# Patient Record
Sex: Female | Born: 1985 | Race: White | Hispanic: No | Smoking: Never smoker
Health system: Southern US, Community
[De-identification: ages and names within clinical notes are randomized; demographics above are authoritative.]

## PROBLEM LIST (undated history)

## (undated) DIAGNOSIS — F329 Major depressive disorder, single episode, unspecified: Secondary | ICD-10-CM

## (undated) DIAGNOSIS — N200 Calculus of kidney: Secondary | ICD-10-CM

## (undated) DIAGNOSIS — D689 Coagulation defect, unspecified: Secondary | ICD-10-CM

## (undated) DIAGNOSIS — G8929 Other chronic pain: Secondary | ICD-10-CM

## (undated) DIAGNOSIS — T7840XA Allergy, unspecified, initial encounter: Secondary | ICD-10-CM

## (undated) DIAGNOSIS — I10 Essential (primary) hypertension: Secondary | ICD-10-CM

## (undated) DIAGNOSIS — F419 Anxiety disorder, unspecified: Secondary | ICD-10-CM

## (undated) DIAGNOSIS — F32A Depression, unspecified: Secondary | ICD-10-CM

## (undated) HISTORY — DX: Calculus of kidney: N20.0

## (undated) HISTORY — DX: Anxiety disorder, unspecified: F41.9

## (undated) HISTORY — DX: Depression, unspecified: F32.A

## (undated) HISTORY — PX: OTHER SURGICAL HISTORY: SHX169

## (undated) HISTORY — DX: Allergy, unspecified, initial encounter: T78.40XA

## (undated) HISTORY — PX: BREAST LUMPECTOMY: SHX2

## (undated) HISTORY — DX: Coagulation defect, unspecified: D68.9

## (undated) HISTORY — DX: Major depressive disorder, single episode, unspecified: F32.9

## (undated) HISTORY — DX: Essential (primary) hypertension: I10

---

## 2006-01-26 ENCOUNTER — Other Ambulatory Visit: Admission: RE | Admit: 2006-01-26 | Discharge: 2006-01-26 | Payer: Self-pay | Admitting: Gynecology

## 2007-08-22 ENCOUNTER — Emergency Department (HOSPITAL_COMMUNITY): Admission: EM | Admit: 2007-08-22 | Discharge: 2007-08-22 | Payer: Self-pay | Admitting: Emergency Medicine

## 2013-01-25 DIAGNOSIS — N6029 Fibroadenosis of unspecified breast: Secondary | ICD-10-CM | POA: Insufficient documentation

## 2013-05-02 DIAGNOSIS — F3342 Major depressive disorder, recurrent, in full remission: Secondary | ICD-10-CM | POA: Insufficient documentation

## 2013-05-02 DIAGNOSIS — F411 Generalized anxiety disorder: Secondary | ICD-10-CM | POA: Insufficient documentation

## 2013-06-02 ENCOUNTER — Ambulatory Visit (INDEPENDENT_AMBULATORY_CARE_PROVIDER_SITE_OTHER): Payer: No Typology Code available for payment source | Admitting: Licensed Clinical Social Worker

## 2013-06-02 DIAGNOSIS — F331 Major depressive disorder, recurrent, moderate: Secondary | ICD-10-CM

## 2013-06-08 ENCOUNTER — Ambulatory Visit (INDEPENDENT_AMBULATORY_CARE_PROVIDER_SITE_OTHER): Payer: No Typology Code available for payment source | Admitting: Licensed Clinical Social Worker

## 2013-06-08 DIAGNOSIS — F331 Major depressive disorder, recurrent, moderate: Secondary | ICD-10-CM

## 2013-06-16 ENCOUNTER — Ambulatory Visit (INDEPENDENT_AMBULATORY_CARE_PROVIDER_SITE_OTHER): Payer: No Typology Code available for payment source | Admitting: Licensed Clinical Social Worker

## 2013-06-16 DIAGNOSIS — F331 Major depressive disorder, recurrent, moderate: Secondary | ICD-10-CM

## 2013-06-23 ENCOUNTER — Ambulatory Visit: Payer: No Typology Code available for payment source | Admitting: Licensed Clinical Social Worker

## 2013-07-25 ENCOUNTER — Ambulatory Visit (INDEPENDENT_AMBULATORY_CARE_PROVIDER_SITE_OTHER): Payer: No Typology Code available for payment source | Admitting: Licensed Clinical Social Worker

## 2013-07-25 DIAGNOSIS — F331 Major depressive disorder, recurrent, moderate: Secondary | ICD-10-CM

## 2013-08-01 ENCOUNTER — Ambulatory Visit (INDEPENDENT_AMBULATORY_CARE_PROVIDER_SITE_OTHER): Payer: No Typology Code available for payment source | Admitting: Licensed Clinical Social Worker

## 2013-08-01 DIAGNOSIS — F331 Major depressive disorder, recurrent, moderate: Secondary | ICD-10-CM

## 2013-08-08 ENCOUNTER — Ambulatory Visit (INDEPENDENT_AMBULATORY_CARE_PROVIDER_SITE_OTHER): Payer: No Typology Code available for payment source | Admitting: Licensed Clinical Social Worker

## 2013-08-08 DIAGNOSIS — F331 Major depressive disorder, recurrent, moderate: Secondary | ICD-10-CM

## 2013-08-15 ENCOUNTER — Ambulatory Visit: Payer: No Typology Code available for payment source | Admitting: Licensed Clinical Social Worker

## 2013-08-22 ENCOUNTER — Ambulatory Visit: Payer: No Typology Code available for payment source | Admitting: Licensed Clinical Social Worker

## 2013-09-05 ENCOUNTER — Ambulatory Visit: Payer: No Typology Code available for payment source | Admitting: Licensed Clinical Social Worker

## 2013-09-19 ENCOUNTER — Ambulatory Visit: Payer: No Typology Code available for payment source | Admitting: Licensed Clinical Social Worker

## 2013-10-03 ENCOUNTER — Ambulatory Visit (INDEPENDENT_AMBULATORY_CARE_PROVIDER_SITE_OTHER): Payer: No Typology Code available for payment source | Admitting: Family Medicine

## 2013-10-03 VITALS — BP 138/76 | HR 101 | Temp 98.0°F | Resp 20 | Ht 66.0 in | Wt 121.4 lb

## 2013-10-03 DIAGNOSIS — M25511 Pain in right shoulder: Secondary | ICD-10-CM

## 2013-10-03 DIAGNOSIS — F439 Reaction to severe stress, unspecified: Secondary | ICD-10-CM

## 2013-10-03 DIAGNOSIS — M25519 Pain in unspecified shoulder: Secondary | ICD-10-CM

## 2013-10-03 DIAGNOSIS — Z733 Stress, not elsewhere classified: Secondary | ICD-10-CM

## 2013-10-03 DIAGNOSIS — M549 Dorsalgia, unspecified: Secondary | ICD-10-CM

## 2013-10-03 MED ORDER — HYDROCODONE-ACETAMINOPHEN 5-325 MG PO TABS: 1.0000 | ORAL_TABLET | Freq: Four times a day (QID) | ORAL | Status: DC | PRN

## 2013-10-03 MED ORDER — PREDNISONE 20 MG PO TABS: ORAL_TABLET | ORAL | Status: DC

## 2013-10-03 MED ORDER — DICLOFENAC SODIUM 75 MG PO TBEC: 75.0000 mg | DELAYED_RELEASE_TABLET | Freq: Two times a day (BID) | ORAL | Status: DC

## 2013-10-03 MED ORDER — METHOCARBAMOL 500 MG PO TABS: ORAL_TABLET | ORAL | Status: DC

## 2013-10-03 NOTE — Progress Notes (Signed)
Subjective: 28 year old lady with a long history of upper back pain problems. She has been hurting badly for the last couple of weeks, though is had some continued pain of the last 3 years. Stress aggravates it. 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 She's had a very difficult sleep. She's been worked up extensively in the past by several specialists, for 5 years ago, with no clear-cut solution. She says when she moves her shoulder around to try and relax it there is a popping in it. She gets a little sensation down her right arm.  Objective: Anxious appearing and at times tearing in lady who keeps wiggling her shoulder around. Good range of motion of right arm. Strength adequate and hand. She is tender in the right shoulder posteriorly. She is tender underneath the skin to peel the. No trauma. Apparently has been worked up in the past. She has not been doing her exercises for long time. She attributes a lot of it to stress.  Assessment: Musculoskeletal pain right upper back Anxiety  Plan: Instructed her on some stretching exercises. Prescribed anti-inflammatory and pain medication for her. Refer to orthopedics back specialist. Advised her to stop wiggling the shoulder around constantly which is grinding it and making it worse.

## 2013-10-03 NOTE — Patient Instructions (Signed)
Take the prednisone ?3 daily for 2 days, then daily for 2 days, then one daily for 2 days. Is taken after breakfast.  Take the diclofenac 75 mg one twice daily for pain and inflammation. This is in a class of a strong ibuprofen or Aleve, so did not take either of them.  Take methocarbamol muscle relaxants one in the morning, one in the afternoon, and 2 at bedtime for muscle relaxation  Take the hydrocodone/APAP 1 every 6 hours if needed for severe pain only. It will tend to cause her drowsiness, some best taken at night to help to rest  Referral is being made to a back specialist  Return here if if further problems that we can try and be of assistance with.

## 2014-03-27 DIAGNOSIS — I7300 Raynaud's syndrome without gangrene: Secondary | ICD-10-CM | POA: Insufficient documentation

## 2014-03-27 DIAGNOSIS — L209 Atopic dermatitis, unspecified: Secondary | ICD-10-CM | POA: Insufficient documentation

## 2014-04-06 DIAGNOSIS — R928 Other abnormal and inconclusive findings on diagnostic imaging of breast: Secondary | ICD-10-CM | POA: Insufficient documentation

## 2014-04-06 DIAGNOSIS — J302 Other seasonal allergic rhinitis: Secondary | ICD-10-CM | POA: Insufficient documentation

## 2014-04-06 DIAGNOSIS — J3089 Other allergic rhinitis: Secondary | ICD-10-CM

## 2014-04-06 DIAGNOSIS — Z832 Family history of diseases of the blood and blood-forming organs and certain disorders involving the immune mechanism: Secondary | ICD-10-CM | POA: Insufficient documentation

## 2014-04-06 DIAGNOSIS — R87619 Unspecified abnormal cytological findings in specimens from cervix uteri: Secondary | ICD-10-CM | POA: Insufficient documentation

## 2014-05-17 DIAGNOSIS — M47812 Spondylosis without myelopathy or radiculopathy, cervical region: Secondary | ICD-10-CM | POA: Insufficient documentation

## 2014-05-17 DIAGNOSIS — M542 Cervicalgia: Secondary | ICD-10-CM | POA: Insufficient documentation

## 2014-06-05 DIAGNOSIS — G950 Syringomyelia and syringobulbia: Secondary | ICD-10-CM | POA: Insufficient documentation

## 2014-06-21 DIAGNOSIS — M5020 Other cervical disc displacement, unspecified cervical region: Secondary | ICD-10-CM | POA: Insufficient documentation

## 2014-06-21 DIAGNOSIS — M47812 Spondylosis without myelopathy or radiculopathy, cervical region: Secondary | ICD-10-CM | POA: Insufficient documentation

## 2014-06-21 DIAGNOSIS — M47814 Spondylosis without myelopathy or radiculopathy, thoracic region: Secondary | ICD-10-CM | POA: Insufficient documentation

## 2014-06-21 DIAGNOSIS — M7918 Myalgia, other site: Secondary | ICD-10-CM | POA: Insufficient documentation

## 2014-12-24 DIAGNOSIS — E876 Hypokalemia: Secondary | ICD-10-CM | POA: Insufficient documentation

## 2014-12-24 DIAGNOSIS — Z87442 Personal history of urinary calculi: Secondary | ICD-10-CM | POA: Insufficient documentation

## 2015-02-18 DIAGNOSIS — M5412 Radiculopathy, cervical region: Secondary | ICD-10-CM | POA: Insufficient documentation

## 2015-10-18 ENCOUNTER — Emergency Department (HOSPITAL_BASED_OUTPATIENT_CLINIC_OR_DEPARTMENT_OTHER): Payer: Managed Care, Other (non HMO)

## 2015-10-18 ENCOUNTER — Emergency Department (HOSPITAL_BASED_OUTPATIENT_CLINIC_OR_DEPARTMENT_OTHER)
Admission: EM | Admit: 2015-10-18 | Discharge: 2015-10-18 | Disposition: A | Discharge status: Home / Self Care | Payer: Managed Care, Other (non HMO) | Attending: Emergency Medicine | Admitting: Emergency Medicine

## 2015-10-18 ENCOUNTER — Encounter (HOSPITAL_BASED_OUTPATIENT_CLINIC_OR_DEPARTMENT_OTHER): Payer: Self-pay | Admitting: Emergency Medicine

## 2015-10-18 DIAGNOSIS — R4781 Slurred speech: Secondary | ICD-10-CM | POA: Diagnosis present

## 2015-10-18 DIAGNOSIS — R202 Paresthesia of skin: Secondary | ICD-10-CM | POA: Diagnosis not present

## 2015-10-18 DIAGNOSIS — Z5181 Encounter for therapeutic drug level monitoring: Secondary | ICD-10-CM | POA: Insufficient documentation

## 2015-10-18 DIAGNOSIS — R479 Unspecified speech disturbances: Secondary | ICD-10-CM | POA: Insufficient documentation

## 2015-10-18 DIAGNOSIS — H539 Unspecified visual disturbance: Secondary | ICD-10-CM | POA: Insufficient documentation

## 2015-10-18 HISTORY — DX: Other chronic pain: G89.29

## 2015-10-18 LAB — COMPREHENSIVE METABOLIC PANEL
ALBUMIN: 4.2 g/dL (ref 3.5–5.0)
ALK PHOS: 30 U/L — AB (ref 38–126)
ALT: 16 U/L (ref 14–54)
AST: 19 U/L (ref 15–41)
Anion gap: 8 (ref 5–15)
BUN: 9 mg/dL (ref 6–20)
CALCIUM: 9.4 mg/dL (ref 8.9–10.3)
CHLORIDE: 105 mmol/L (ref 101–111)
CO2: 24 mmol/L (ref 22–32)
Creatinine, Ser: 0.56 mg/dL (ref 0.44–1.00)
GFR calc Af Amer: >60 mL/min (ref >60)
GFR calc non Af Amer: >60 mL/min (ref >60)
GLUCOSE: 91 mg/dL (ref 65–99)
Potassium: 3.3 mmol/L — ABNORMAL LOW (ref 3.5–5.1)
SODIUM: 137 mmol/L (ref 135–145)
TOTAL PROTEIN: 7.1 g/dL (ref 6.5–8.1)
Total Bilirubin: 1 mg/dL (ref 0.3–1.2)

## 2015-10-18 LAB — RAPID URINE DRUG SCREEN, HOSP PERFORMED
AMPHETAMINES: POSITIVE — AB
BENZODIAZEPINES: NOT DETECTED
Barbiturates: NOT DETECTED
Cocaine: NOT DETECTED
OPIATES: NOT DETECTED
Tetrahydrocannabinol: NOT DETECTED

## 2015-10-18 LAB — DIFFERENTIAL
BASOS ABS: 0 10*3/uL (ref 0.0–0.1)
BASOS PCT: 0 %
Eosinophils Absolute: 0.1 10*3/uL (ref 0.0–0.7)
Eosinophils Relative: 1 %
LYMPHS PCT: 34 %
Lymphs Abs: 3.4 10*3/uL (ref 0.7–4.0)
Monocytes Absolute: 0.7 10*3/uL (ref 0.1–1.0)
Monocytes Relative: 7 %
NEUTROS ABS: 5.8 10*3/uL (ref 1.7–7.7)
Neutrophils Relative %: 58 %

## 2015-10-18 LAB — CBC
HCT: 35 % — ABNORMAL LOW (ref 36.0–46.0)
Hemoglobin: 12 g/dL (ref 12.0–15.0)
MCH: 31.7 pg (ref 26.0–34.0)
MCHC: 34.3 g/dL (ref 30.0–36.0)
MCV: 92.3 fL (ref 78.0–100.0)
PLATELETS: 270 10*3/uL (ref 150–400)
RBC: 3.79 MIL/uL — AB (ref 3.87–5.11)
RDW: 12.2 % (ref 11.5–15.5)
WBC: 10 10*3/uL (ref 4.0–10.5)

## 2015-10-18 LAB — URINALYSIS, ROUTINE W REFLEX MICROSCOPIC
BILIRUBIN URINE: NEGATIVE
Glucose, UA: NEGATIVE mg/dL
Ketones, ur: 40 mg/dL — AB
Leukocytes, UA: NEGATIVE
Nitrite: NEGATIVE
PH: 7.5 (ref 5.0–8.0)
Protein, ur: NEGATIVE mg/dL
SPECIFIC GRAVITY, URINE: 1.015 (ref 1.005–1.030)

## 2015-10-18 LAB — PROTIME-INR
INR: 0.95
PROTHROMBIN TIME: 12.7 s (ref 11.4–15.2)

## 2015-10-18 LAB — TROPONIN I

## 2015-10-18 LAB — URINE MICROSCOPIC-ADD ON: WBC, UA: NONE SEEN WBC/hpf (ref 0–5)

## 2015-10-18 LAB — ETHANOL

## 2015-10-18 LAB — APTT: APTT: 27 s (ref 24–36)

## 2015-10-18 MED ORDER — LORAZEPAM 2 MG/ML IJ SOLN
1.0000 mg | Freq: Once | INTRAMUSCULAR | Status: AC
Start: 2015-10-18 — End: 2015-10-18
  Administered 2015-10-18: 1 mg via INTRAVENOUS
  Filled 2015-10-18: qty 1

## 2015-10-18 NOTE — ED Notes (Signed)
Pt reports that the right side of her body is tingling all over.  Started at 5pm today.  pts speech is normal, CVA screen negative.

## 2015-10-18 NOTE — ED Triage Notes (Addendum)
Pt reports visual changes and slurred speech since around 9am. Denies HA. Pt moving all extremities well. Under stress at work. Pt concerned she having a stroke.

## 2015-10-18 NOTE — ED Provider Notes (Addendum)
[facility] Provider Note   CSN: [id number] Arrival date & time: 10/18/15  2058   By signing my name below, I, Valentino Saxon, attest that this documentation has been prepared under the direction and in the presence of Melene Plan, DO. Electronically Signed: Valentino Saxon, ED Scribe. 10/18/15. 10:29 PM.  History   Chief Complaint Chief Complaint  Patient presents with  . Eye Problem  . Aphasia   The history is provided by the patient. No language interpreter was used.  Eye Problem   This is a new problem. The current episode started 12 to 24 hours ago. The problem occurs rarely. The problem has been resolved. There is a problem in both eyes. There was no injury mechanism. The pain is at a severity of 0/10. The patient is experiencing no pain. Pertinent negatives include no eye redness, no nausea and no vomiting. Associated symptoms comments: parasthesias.    HPI Comments: Lynn Matthews is a 30 y.o. female who presents to the Emergency Department complaining of intermittent, slurred speech onset this afternoon at 5 pm. Pt reports associated tingling radiating down right side and neck pain. Pt notes gradually worsening symptoms presented being at the ED. Pt denies LOC and head injury. She denies HA, abdominal pain.  She denies h/o of similar symptoms, HTN, DM. Phx of stenosis, chronic depression, PTSD. Fhx of HTN.   Past Medical History:  Diagnosis Date  . Anxiety   . Chronic pain   . Clotting disorder (HCC)   . Depression   . Kidney stones     There are no active problems to display for this patient.   Past Surgical History:  Procedure Laterality Date  . BREAST LUMPECTOMY Right   . removal of kidney stone      OB History    No data available       Home Medications    Prior to Admission medications   Medication Sig Start Date End Date Taking? Authorizing Provider  clonazePAM (KLONOPIN) 1 MG tablet Take 1 mg by mouth daily.    Historical  Provider, MD  diclofenac (VOLTAREN) 75 MG EC tablet Take 1 tablet (75 mg total) by mouth 2 (two) times daily. 10/03/13   Peyton Najjar, MD  HYDROcodone-acetaminophen (NORCO) 5-325 MG per tablet Take 1 tablet by mouth every 6 (six) hours as needed. 10/03/13   Peyton Najjar, MD  methocarbamol (ROBAXIN) 500 MG tablet Take one in the morning, one in the afternoon, and 2 at bedtime for muscle relaxants 10/03/13   Peyton Najjar, MD  predniSONE (DELTASONE) 20 MG tablet Take 3 daily for 2 days, then 2 daily for 2 days, then one daily for 2 days, for inflammation 10/03/13   Peyton Najjar, MD    Family History Family History  Problem Relation Age of Onset  . Hyperlipidemia Mother   . Hypertension Father   . Hyperlipidemia Brother     Social History Social History  Substance Use Topics  . Smoking status: Never Smoker  . Smokeless tobacco: Never Used  . Alcohol use Yes     Comment: social use     Allergies   Lamictal [lamotrigine]   Review of Systems Review of Systems  Constitutional: Negative for chills and fever.  HENT: Negative for congestion and rhinorrhea.   Eyes: Negative for redness and visual disturbance.  Respiratory: Negative for shortness of breath and wheezing.   Cardiovascular: Negative for chest pain and palpitations.  Gastrointestinal: Negative for nausea and vomiting.  Genitourinary: Negative for dysuria and urgency.  Musculoskeletal: Negative for arthralgias and myalgias.  Skin: Negative for pallor and wound.  Neurological: Positive for speech difficulty. Negative for dizziness and headaches.     Physical Exam Updated Vital Signs BP (!) 168/107 (BP Location: Left Arm)   Pulse 99   Temp 98.6 F (37 C) (Oral)   Resp 18   Ht 5\' 5"  (1.651 m)   Wt 120 lb (54.4 kg)   SpO2 100%   BMI 19.97 kg/m   Physical Exam  Constitutional: She is oriented to person, place, and time. She appears well-developed and well-nourished. No distress.  HENT:  Head: Normocephalic  and atraumatic.  Eyes: EOM are normal. Pupils are equal, round, and reactive to light.  Neck: Normal range of motion. Neck supple.  Cardiovascular: Normal rate and regular rhythm.  Exam reveals no gallop and no friction rub.   No murmur heard. Pulmonary/Chest: Effort normal. She has no wheezes. She has no rales.  Abdominal: Soft. She exhibits no distension. There is no tenderness.  Musculoskeletal: She exhibits no edema or tenderness.  Neurological: She is alert and oriented to person, place, and time. She has normal strength. No cranial nerve deficit or sensory deficit. She displays a negative Romberg sign. Coordination and gait normal. GCS eye subscore is 4. GCS verbal subscore is 5. GCS motor subscore is 6. She displays no Babinski's sign on the right side. She displays no Babinski's sign on the left side.  Reflex Scores:      Tricep reflexes are 2+ on the right side and 2+ on the left side.      Bicep reflexes are 2+ on the right side and 2+ on the left side.      Brachioradialis reflexes are 2+ on the right side and 2+ on the left side.      Patellar reflexes are 2+ on the right side and 2+ on the left side.      Achilles reflexes are 2+ on the right side and 2+ on the left side. Skin: Skin is warm and dry. She is not diaphoretic.  Psychiatric: She has a normal mood and affect. Her behavior is normal.  Nursing note and vitals reviewed.    ED Treatments / Results   DIAGNOSTIC STUDIES: Oxygen Saturation is 100% on RA, normal by my interpretation.    COORDINATION OF CARE: 9:25 PM Discussed treatment plan with pt at bedside which includes head CT and pt agreed to plan.   Labs (all labs ordered are listed, but only abnormal results are displayed) Labs Reviewed  CBC - Abnormal; Notable for the following:       Result Value   RBC 3.79 (*)    HCT 35.0 (*)    All other components within normal limits  COMPREHENSIVE METABOLIC PANEL - Abnormal; Notable for the following:    Potassium  3.3 (*)    Alkaline Phosphatase 30 (*)    All other components within normal limits  RAPID URINE DRUG SCREEN, HOSP PERFORMED - Abnormal; Notable for the following:    Amphetamines POSITIVE (*)    All other components within normal limits  URINALYSIS, ROUTINE W REFLEX MICROSCOPIC (NOT AT [facility]) - Abnormal; Notable for the following:    Hgb urine dipstick SMALL (*)    Ketones, ur 40 (*)    All other components within normal limits  URINE MICROSCOPIC-ADD ON - Abnormal; Notable for the following:    Squamous Epithelial / LPF 0-5 (*)    Bacteria,  UA RARE (*)    All other components within normal limits  ETHANOL  PROTIME-INR  APTT  DIFFERENTIAL  TROPONIN I    EKG  EKG Interpretation  Date/Time:  Friday October 18 2015 21:39:20 EDT Ventricular Rate:  95 PR Interval:    QRS Duration: 103 QT Interval:  378 QTC Calculation: 476 R Axis:   58 Text Interpretation:  Sinus rhythm Probable left atrial enlargement Borderline prolonged QT interval No significant change since last tracing Confirmed by Tennyson Kallen MD, Reuel Boom [id number]) on 10/18/2015 9:58:17 PM       Radiology Ct Head Wo Contrast  Result Date: 10/18/2015 CLINICAL DATA:  Slurred speech, difficulty with balance, tingling on right side of arm and leg EXAM: CT HEAD WITHOUT CONTRAST TECHNIQUE: Contiguous axial images were obtained from the base of the skull through the vertex without intravenous contrast. COMPARISON:  None. FINDINGS: Brain: No evidence of acute infarction, hemorrhage, hydrocephalus, extra-axial collection or mass lesion/mass effect. Evaluation of the lower brain is limited by patient motion artifact. Vascular: No hyperdense vessel or unexpected calcification. Skull: Normal. Negative for fracture or focal lesion. Sinuses/Orbits: Lobulated mucosal thickening in the right maxillary sinus. Orbits appear grossly intact. Other: None IMPRESSION: Examination is mildly compromised by patient motion. No CT evidence for acute intracranial  abnormality. MRI follow-up may be performed as clinically indicated. Electronically Signed   By: Jasmine Pang M.D.   On: 10/18/2015 22:16    Procedures Procedures (including critical care time)  Medications Ordered in ED Medications  LORazepam (ATIVAN) injection 1 mg (1 mg Intravenous Given 10/18/15 2205)     Initial Impression / Assessment and Plan / ED Course  I have reviewed the triage vital signs and the nursing notes.  Pertinent labs & imaging results that were available during my care of the patient were reviewed by me and considered in my medical decision making (see chart for details).  Clinical Course    30 yo F with transient aphasia, R sided paresthesias.  Difficult hx as patient changes symptoms and timeline during exam.  No focal neuro deficits.  At one point patient became frustrated and symptoms seem to completely abate.  I discussed the case with Dr. Roxy Manns who did not feel that imagine or transfer was warranted at this time.   CT negative, patient feels much better after ativan IV.  Suspect likely psychogenic, nursing found out that patient has just been released from a psychiatric facility 2 days ago.   10:29 PM:  I have discussed the diagnosis/risks/treatment options with the patient and family and believe the pt to be eligible for discharge home to follow-up with PCP. We also discussed returning to the ED immediately if new or worsening sx occur. We discussed the sx which are most concerning (e.g., recurrent symptoms) that necessitate immediate return. Medications administered to the patient during their visit and any new prescriptions provided to the patient are listed below.  Medications given during this visit Medications  LORazepam (ATIVAN) injection 1 mg (1 mg Intravenous Given 10/18/15 2205)     The patient appears reasonably screen and/or stabilized for discharge and I doubt any other medical condition or other [facility] requiring further screening, evaluation, or  treatment in the ED at this time prior to discharge.    Final Clinical Impressions(s) / ED Diagnoses   Final diagnoses:  Visual disturbance  Paresthesia  Speech disturbance, unspecified type    New Prescriptions New Prescriptions   No medications on file   I personally performed the services  described in this documentation, which was scribed in my presence. The recorded information has been reviewed and is accurate.      Melene Plan, DO 10/18/15 2227    Melene Plan, DO 10/18/15 2229

## 2016-12-13 IMAGING — CT CT HEAD W/O CM
3 series · 15 of 47 positions shown, 18 images · non-contrast
Comparison: None.

CLINICAL DATA: Slurred speech, difficulty with balance, tingling on
right side of arm and leg

EXAM:
CT HEAD WITHOUT CONTRAST
TECHNIQUE: Contiguous axial images were obtained from the base of the skull
through the vertex without intravenous contrast.

[Series 2: head wo · axial · 0.39mm/px · z∈[-176,-46]mm · 9 of 32 slices shown, 12 images]
[im 3/32  brain]
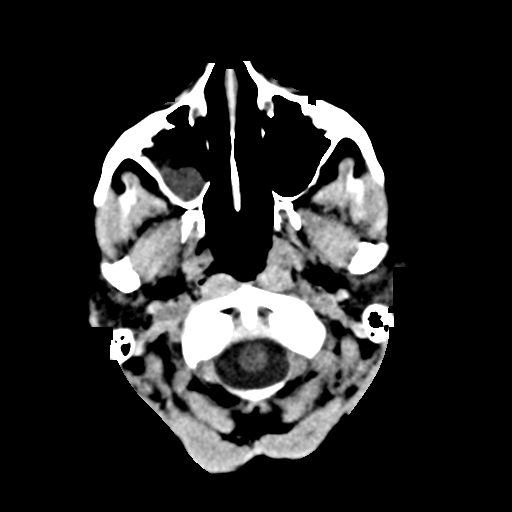
[im 3/32  bone]
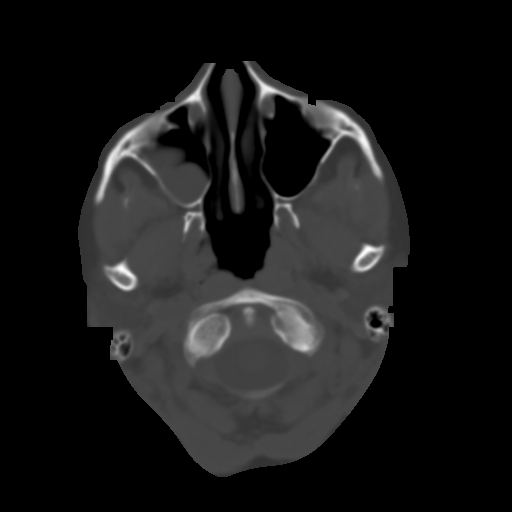
[im 6/32  brain]
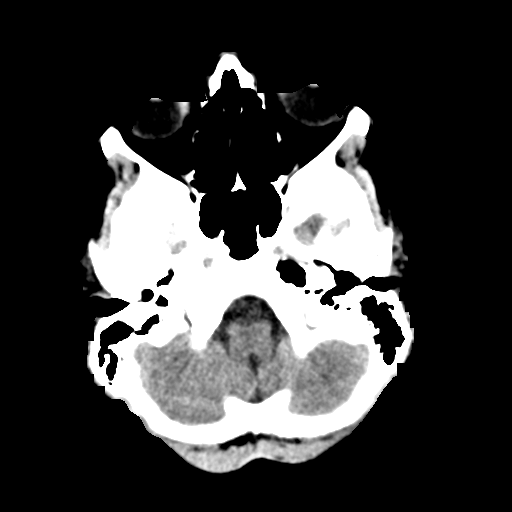
[im 9/32  brain]
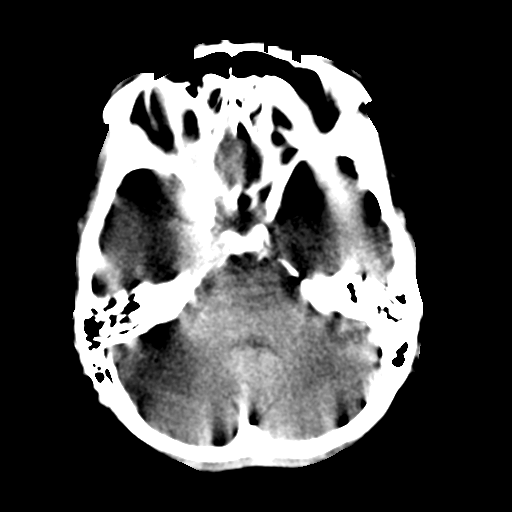
[im 12/32  brain]
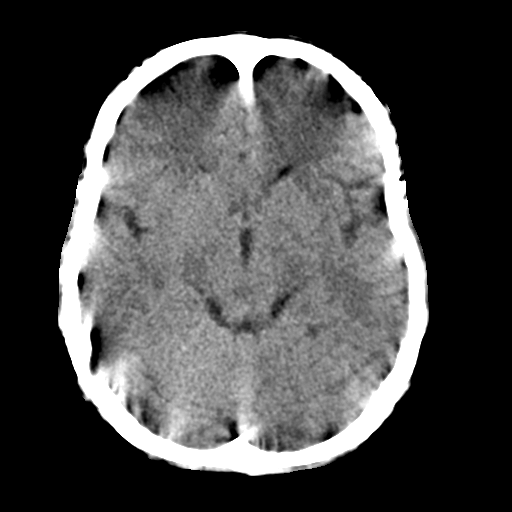
[im 17/32  brain]
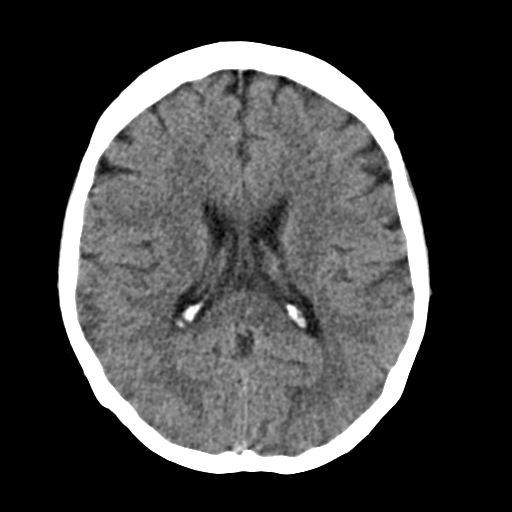
[im 17/32  bone]
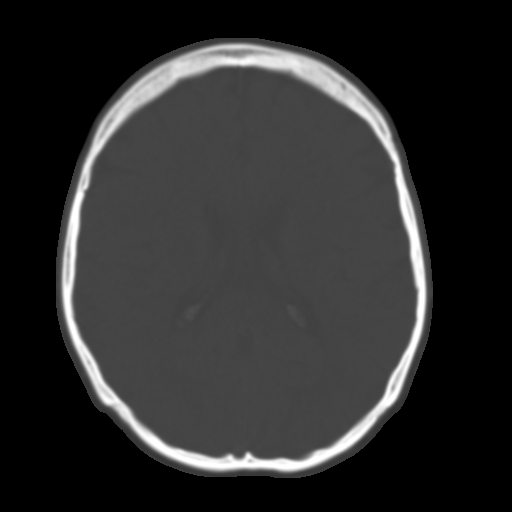
[im 20/32  brain]
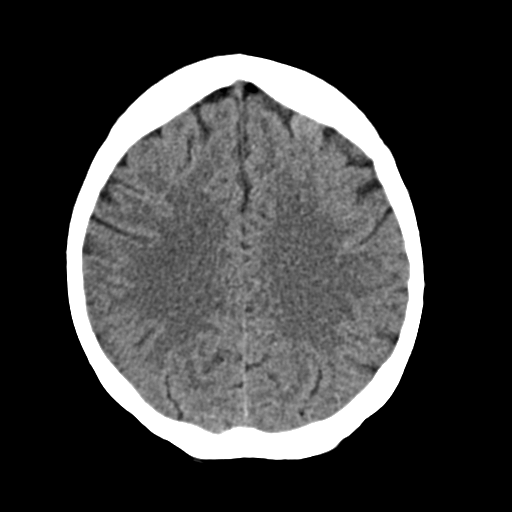
[im 23/32  brain]
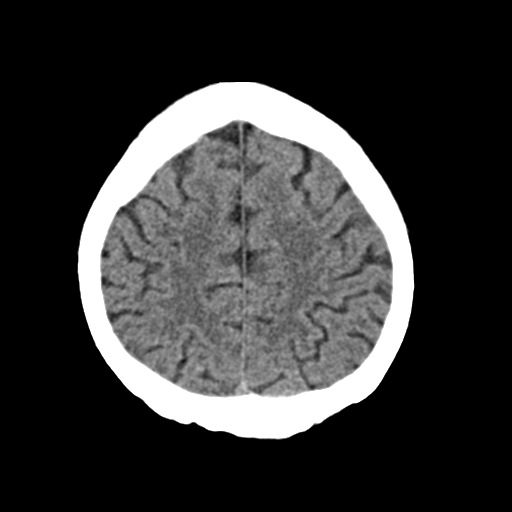
[im 26/32  brain]
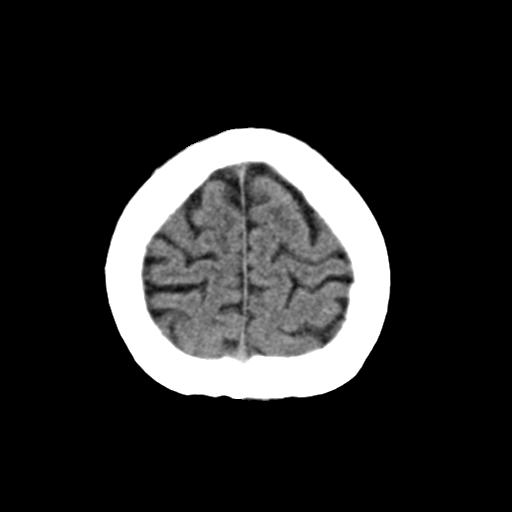
[im 29/32  brain]
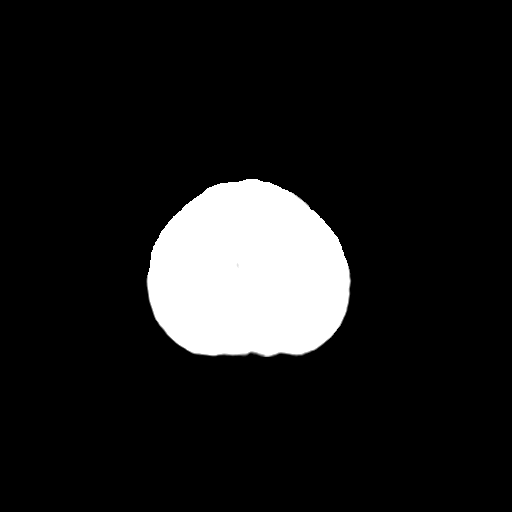
[im 29/32  bone]
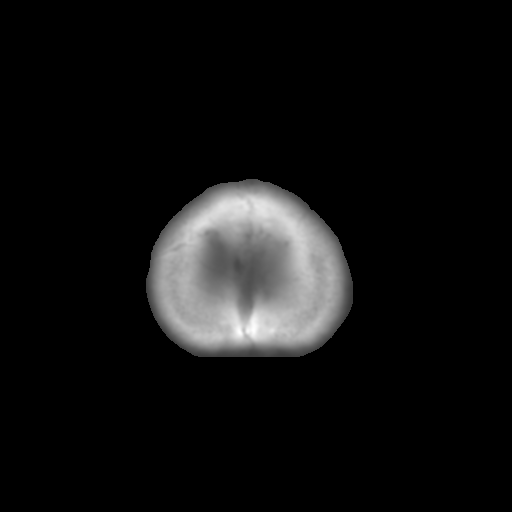

[Series 4: coronal soft · coronal · 0.37mm/px · 3 of 63 slices shown]
[im 21/63  brain]
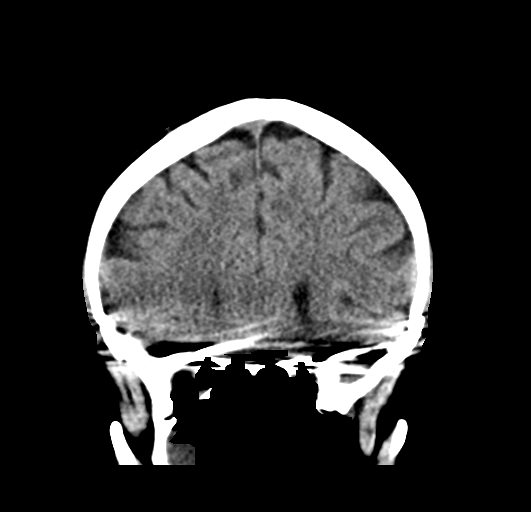
[im 28/63  brain]
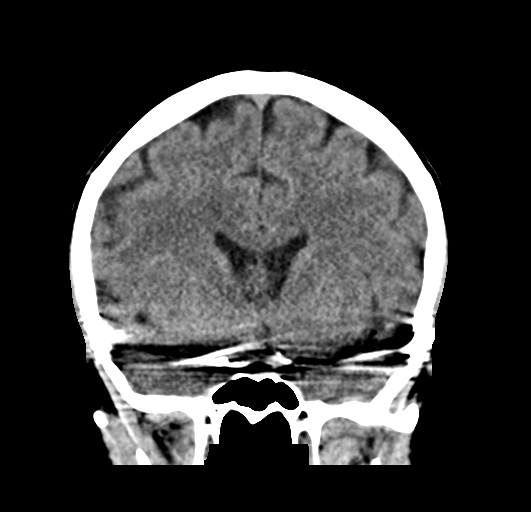
[im 35/63  brain]
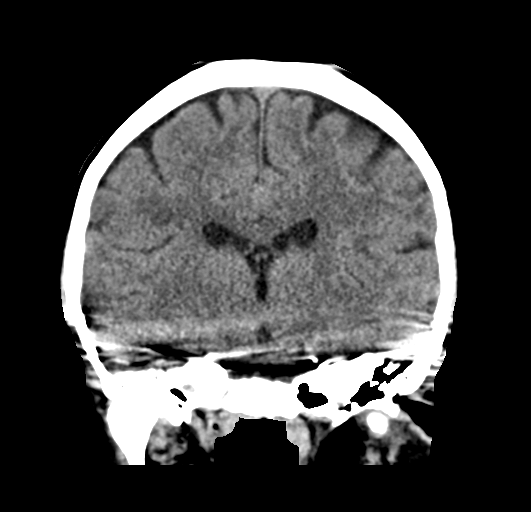

[Series 5: sag soft · sagittal · 0.31mm/px · 3 of 54 slices shown]
[im 18/54  brain]
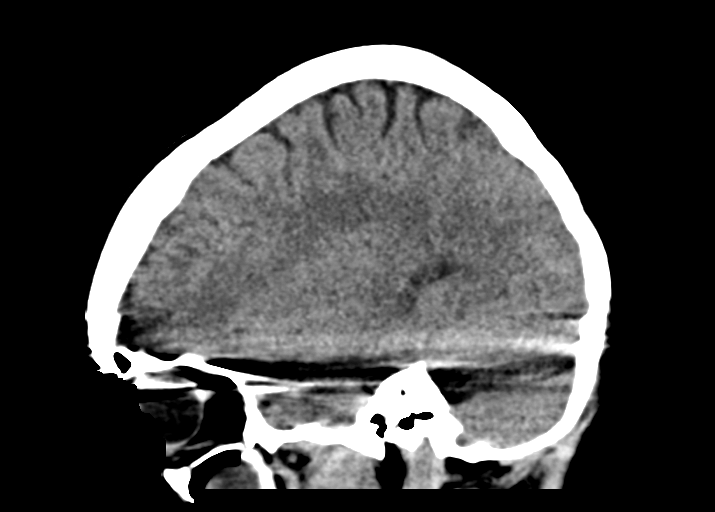
[im 27/54  brain]
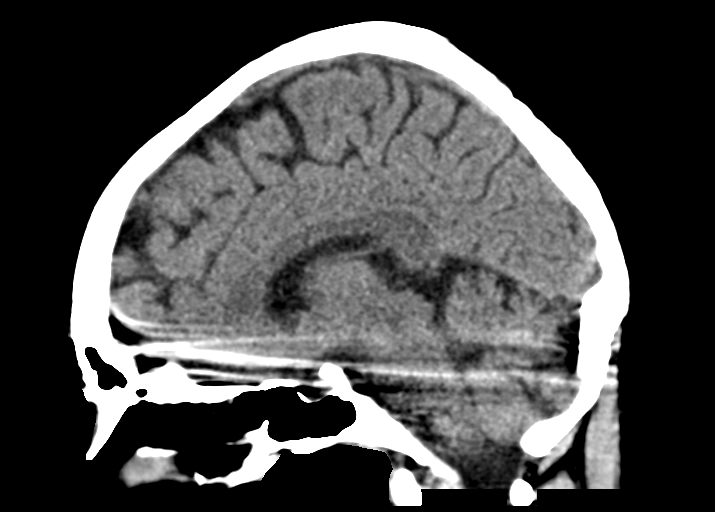
[im 36/54  brain]
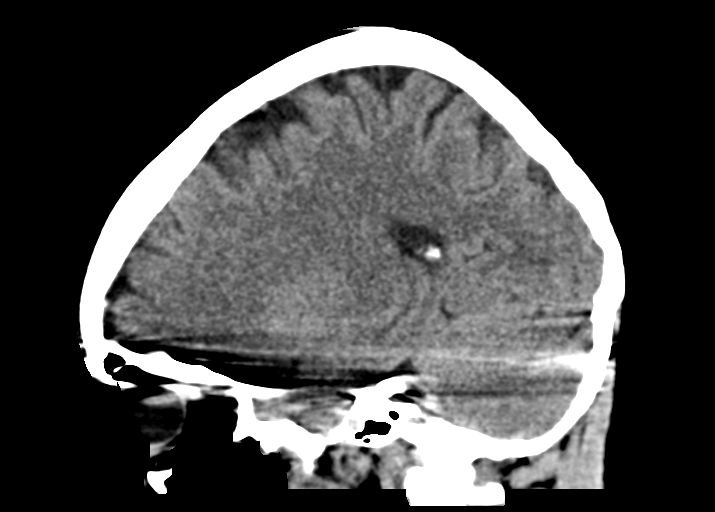

[15 of 47 positions shown; findings below may reference images not displayed]

FINDINGS: Brain: No evidence of acute infarction, hemorrhage, hydrocephalus,
extra-axial collection or mass lesion/mass effect. Evaluation of the
lower brain is limited by patient motion artifact.

Vascular: No hyperdense vessel or unexpected calcification.

Skull: Normal. Negative for fracture or focal lesion.

Sinuses/Orbits: Lobulated mucosal thickening in the right maxillary
sinus. Orbits appear grossly intact.

Other: None
IMPRESSION: Examination is mildly compromised by patient motion. No CT evidence
for acute intracranial abnormality. MRI follow-up may be performed
as clinically indicated.

## 2017-02-24 DIAGNOSIS — R569 Unspecified convulsions: Secondary | ICD-10-CM | POA: Insufficient documentation

## 2017-09-24 DIAGNOSIS — F4310 Post-traumatic stress disorder, unspecified: Secondary | ICD-10-CM | POA: Diagnosis not present

## 2017-09-24 DIAGNOSIS — F3342 Major depressive disorder, recurrent, in full remission: Secondary | ICD-10-CM | POA: Diagnosis not present

## 2017-09-24 DIAGNOSIS — F411 Generalized anxiety disorder: Secondary | ICD-10-CM | POA: Diagnosis not present

## 2017-09-24 MED FILL — DULoxetine HCL 60 MG CPEP: 60 | 90 days supply | Qty: 180 | Fill #0

## 2017-10-27 DIAGNOSIS — Z23 Encounter for immunization: Secondary | ICD-10-CM | POA: Diagnosis not present

## 2017-12-13 DIAGNOSIS — F411 Generalized anxiety disorder: Secondary | ICD-10-CM | POA: Diagnosis not present

## 2017-12-13 DIAGNOSIS — F4310 Post-traumatic stress disorder, unspecified: Secondary | ICD-10-CM | POA: Diagnosis not present

## 2017-12-13 DIAGNOSIS — F3342 Major depressive disorder, recurrent, in full remission: Secondary | ICD-10-CM | POA: Diagnosis not present

## 2017-12-16 ENCOUNTER — Ambulatory Visit: Payer: Self-pay | Admitting: Family Medicine

## 2017-12-16 DIAGNOSIS — Z0289 Encounter for other administrative examinations: Secondary | ICD-10-CM

## 2018-01-07 DIAGNOSIS — F4310 Post-traumatic stress disorder, unspecified: Secondary | ICD-10-CM | POA: Diagnosis not present

## 2018-01-07 DIAGNOSIS — F411 Generalized anxiety disorder: Secondary | ICD-10-CM | POA: Diagnosis not present

## 2018-01-07 DIAGNOSIS — F3342 Major depressive disorder, recurrent, in full remission: Secondary | ICD-10-CM | POA: Diagnosis not present

## 2018-02-10 ENCOUNTER — Ambulatory Visit: Payer: No Typology Code available for payment source | Admitting: Family Medicine

## 2018-02-11 MED FILL — DULOXETINE HCL 60 MG CPEP: 60 | 90 days supply | Qty: 180 | Fill #0 | Status: TO

## 2018-02-11 MED FILL — DEXTROAMP-AMPHETAMIN 20 MG: 20 | 30 days supply | Qty: 60 | Fill #0

## 2018-02-21 DIAGNOSIS — L811 Chloasma: Secondary | ICD-10-CM | POA: Diagnosis not present

## 2018-02-21 DIAGNOSIS — L700 Acne vulgaris: Secondary | ICD-10-CM | POA: Diagnosis not present

## 2018-02-22 MED FILL — SPIRONOLACTONE 25 MG TABS: 25 | 90 days supply | Qty: 90 | Fill #0 | Status: TO

## 2018-02-22 MED FILL — HYDROQUINONE 4 % CREA: 4 | 30 days supply | Qty: 57 | Fill #0 | Status: TO

## 2018-02-22 MED FILL — clonazePAM 1 MG TABS: 1 | 30 days supply | Qty: 60 | Fill #0 | Status: TO

## 2018-03-01 ENCOUNTER — Ambulatory Visit: Payer: 59 | Admitting: Internal Medicine

## 2018-03-01 ENCOUNTER — Other Ambulatory Visit: Payer: Self-pay

## 2018-03-01 ENCOUNTER — Encounter: Payer: Self-pay | Admitting: Internal Medicine

## 2018-03-01 VITALS — BP 132/72 | HR 89 | Temp 98.1°F | Ht 65.6 in | Wt 127.6 lb

## 2018-03-01 DIAGNOSIS — Z3202 Encounter for pregnancy test, result negative: Secondary | ICD-10-CM | POA: Diagnosis not present

## 2018-03-01 DIAGNOSIS — L709 Acne, unspecified: Secondary | ICD-10-CM

## 2018-03-01 DIAGNOSIS — M4804 Spinal stenosis, thoracic region: Secondary | ICD-10-CM | POA: Insufficient documentation

## 2018-03-01 DIAGNOSIS — N200 Calculus of kidney: Secondary | ICD-10-CM | POA: Insufficient documentation

## 2018-03-01 DIAGNOSIS — N912 Amenorrhea, unspecified: Secondary | ICD-10-CM | POA: Diagnosis not present

## 2018-03-01 DIAGNOSIS — F329 Major depressive disorder, single episode, unspecified: Secondary | ICD-10-CM | POA: Insufficient documentation

## 2018-03-01 DIAGNOSIS — M5030 Other cervical disc degeneration, unspecified cervical region: Secondary | ICD-10-CM | POA: Insufficient documentation

## 2018-03-01 DIAGNOSIS — R569 Unspecified convulsions: Secondary | ICD-10-CM

## 2018-03-01 MED ORDER — DAPSONE 5 % EX GEL: 1.0000 "application " | Freq: Every day | CUTANEOUS | 0 refills | Status: DC

## 2018-03-01 MED ORDER — ROC RETINOL CORREXION EX CREA: TOPICAL_CREAM | CUTANEOUS | 0 refills | Status: DC

## 2018-03-01 NOTE — Patient Instructions (Addendum)
   Look at this web site for diet for POS  [email or web address].com

## 2018-03-01 NOTE — Progress Notes (Signed)
Subjective:     Patient ID: Lynn Matthews , female    DOB: 19-Feb-1985 , 33 y.o.   MRN: [id number]   Chief Complaint  Patient presents with  . New Patient (Initial Visit)    HPI She is here to establish care with [place] and saw PCP  [facility]ia [facility] in the past.  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 She will ran out of health insurance this month since she is moving to [facility] and does not have a job lined up yet. Used to be on oral contraception of orthotrycycline low. Would like to get back on yaz in the past and has done well with this.  Had abnormal pap in her early 20's, since then been normal. Last pap 2017. Did not have leep or cryo. She thinks she had HPV.  LMP 5 weeks ago.   Past Medical History:  Diagnosis Date  . Anxiety   . Chronic pain   . Clotting disorder (HCC)   . Depression   . Kidney stones      Family History  Problem Relation Age of Onset  . Hyperlipidemia Mother   . Hypertension Father   . Hyperlipidemia Brother      Current Outpatient Medications:  .  amphetamine-dextroamphetamine (ADDERALL) 20 MG tablet, Take 20 mg by mouth daily., Disp: , Rfl:  .  DULoxetine (CYMBALTA) 20 MG capsule, Take 20 mg by mouth daily., Disp: , Rfl:  .  spironolactone (ALDACTONE) 25 MG tablet, Take 25 mg by mouth daily., Disp: , Rfl:  .  clonazePAM (KLONOPIN) 1 MG tablet, Take 1 mg by mouth daily., Disp: , Rfl:  .  diclofenac (VOLTAREN) 75 MG EC tablet, Take 1 tablet (75 mg total) by mouth 2 (two) times daily., Disp: 30 tablet, Rfl: 1 .  HYDROcodone-acetaminophen (NORCO) 5-325 MG per tablet, Take 1 tablet by mouth every 6 (six) hours as needed., Disp: 24 tablet, Rfl: 0 .  methocarbamol (ROBAXIN) 500 MG tablet, Take one in the morning, one in the afternoon, and 2 at bedtime for muscle relaxants, Disp: 40 tablet, Rfl: 1 .  predniSONE (DELTASONE) 20 MG tablet, Take 3 daily for 2 days, then 2 daily for 2 days, then one daily for 2 days, for inflammation, Disp: 12 tablet,  Rfl: 0   Allergies  Allergen Reactions  . Lamictal [Lamotrigine] Rash     Review of Systems  Constitutional: Negative.   HENT: Positive for postnasal drip, rhinorrhea and sneezing. Negative for congestion, ear discharge, ear pain, mouth sores, sinus pressure, sinus pain, sore throat and trouble swallowing.   Eyes: Negative for discharge and visual disturbance.  Respiratory: Negative for cough and shortness of breath.   Cardiovascular: Negative for chest pain, palpitations and leg swelling.  Gastrointestinal: Positive for diarrhea.       Gets loose stools when anxious  Endocrine: Negative for cold intolerance, heat intolerance, polydipsia and polyphagia.  Genitourinary: Negative for difficulty urinating, frequency, vaginal discharge and vaginal pain.       Has irregular periods  Skin: Positive for rash. Negative for color change, pallor and wound.       Acne on face which is new  Allergic/Immunologic: Positive for environmental allergies.  Neurological: Negative for dizziness and numbness.  Hematological: Negative for adenopathy.  Psychiatric/Behavioral: The patient is nervous/anxious.        Depression under care of psychiatrist     Today's Vitals   03/01/18 1437  BP: 132/72  Pulse: 89  Temp: 98.1 F (36.7 C)  TempSrc: Oral  SpO2: 98%  Weight: 127 lb 9.6 oz (57.9 kg)  Height: 5' 5.6" (1.666 m)   Body mass index is 20.85 kg/m.   Objective:  Physical Exam Vitals signs and nursing note reviewed.  Constitutional:      Appearance: Normal appearance. She is normal weight.  HENT:     Head: Normocephalic.     Right Ear: Tympanic membrane normal.     Left Ear: Tympanic membrane normal.     Nose: Nose normal.  Eyes:     General: No scleral icterus.    Conjunctiva/sclera: Conjunctivae normal.  Neck:     Musculoskeletal: Neck supple. No neck rigidity.  Cardiovascular:     Rate and Rhythm: Normal rate and regular rhythm.     Heart sounds: No murmur.  Pulmonary:      Effort: Pulmonary effort is normal.     Breath sounds: Normal breath sounds.  Musculoskeletal: Normal range of motion.  Lymphadenopathy:     Cervical: No cervical adenopathy.  Skin:    General: Skin is warm and dry.  Neurological:     Mental Status: She is alert and oriented to person, place, and time.     Gait: Gait normal.  Psychiatric:        Behavior: Behavior normal.        Thought Content: Thought content normal.        Judgment: Judgment normal.     Comments: Seems anxious      Assessment And Plan:   1. Acne, unspecified acne type- under care with Dermatologist.   2. Amenorrhea- since off birth control pills.  - POCT Urine Pregnancy- neg She will come tomorrow for physical and pap.      Jeydi Klingel RODRIGUEZ-SOUTHWORTH, PA-C

## 2018-03-02 ENCOUNTER — Other Ambulatory Visit (HOSPITAL_COMMUNITY)
Admission: RE | Admit: 2018-03-02 | Discharge: 2018-03-02 | Disposition: A | Discharge status: Home / Self Care | Payer: 59 | Source: Ambulatory Visit | Attending: Internal Medicine | Admitting: Internal Medicine

## 2018-03-02 ENCOUNTER — Encounter: Payer: Self-pay | Admitting: Internal Medicine

## 2018-03-02 ENCOUNTER — Ambulatory Visit: Payer: 59 | Admitting: Internal Medicine

## 2018-03-02 VITALS — BP 118/78 | HR 94 | Temp 98.1°F | Ht 66.6 in | Wt 127.5 lb

## 2018-03-02 DIAGNOSIS — Z0000 Encounter for general adult medical examination without abnormal findings: Secondary | ICD-10-CM | POA: Diagnosis not present

## 2018-03-02 DIAGNOSIS — Z113 Encounter for screening for infections with a predominantly sexual mode of transmission: Secondary | ICD-10-CM | POA: Diagnosis not present

## 2018-03-02 DIAGNOSIS — Z91018 Allergy to other foods: Secondary | ICD-10-CM | POA: Diagnosis not present

## 2018-03-02 DIAGNOSIS — Z30011 Encounter for initial prescription of contraceptive pills: Secondary | ICD-10-CM

## 2018-03-02 DIAGNOSIS — Z124 Encounter for screening for malignant neoplasm of cervix: Secondary | ICD-10-CM | POA: Diagnosis not present

## 2018-03-02 DIAGNOSIS — R748 Abnormal levels of other serum enzymes: Secondary | ICD-10-CM | POA: Diagnosis not present

## 2018-03-02 DIAGNOSIS — E559 Vitamin D deficiency, unspecified: Secondary | ICD-10-CM

## 2018-03-02 DIAGNOSIS — Z131 Encounter for screening for diabetes mellitus: Secondary | ICD-10-CM

## 2018-03-02 DIAGNOSIS — Z30019 Encounter for initial prescription of contraceptives, unspecified: Secondary | ICD-10-CM

## 2018-03-02 DIAGNOSIS — E538 Deficiency of other specified B group vitamins: Secondary | ICD-10-CM

## 2018-03-02 DIAGNOSIS — M419 Scoliosis, unspecified: Secondary | ICD-10-CM

## 2018-03-02 LAB — POCT URINALYSIS DIPSTICK
Bilirubin, UA: NEGATIVE
Blood, UA: NEGATIVE
GLUCOSE UA: NEGATIVE
Ketones, UA: NEGATIVE
LEUKOCYTES UA: NEGATIVE
NITRITE UA: NEGATIVE
PROTEIN UA: NEGATIVE
SPEC GRAV UA: 1.02 (ref 1.010–1.025)
Urobilinogen, UA: 0.2 E.U./dL
pH, UA: 7 (ref 5.0–8.0)

## 2018-03-02 LAB — POCT URINE PREGNANCY: PREG TEST UR: NEGATIVE

## 2018-03-02 MED ORDER — FLUTICASONE PROPIONATE 50 MCG/ACT NA SUSP: 2.0000 | Freq: Every day | NASAL | 11 refills | Status: DC

## 2018-03-02 MED ORDER — DROSPIRENONE-ETHINYL ESTRADIOL 3-0.02 MG PO TABS: 1.0000 | ORAL_TABLET | Freq: Every day | ORAL | 11 refills | Status: DC

## 2018-03-02 MED ORDER — VITAMIN D2 50 MCG (2000 UT) PO TABS: ORAL_TABLET | ORAL | 0 refills | Status: DC

## 2018-03-02 NOTE — Patient Instructions (Addendum)
FOLOW DR AXE'S POS  DIET RECOMMENDATIONS FOR AT LEAST 6 MONTHS TO BALANCE YOUR HORMOES AND IF THIS DOES NOT SEEM TO HELP, COME SEE DR [facility] FOR SALIVARY HORMONE TESTING THOUGH [facility].   Preventive Care 18-39 Years, Female Preventive care refers to lifestyle choices and visits with your health care provider that can promote health and wellness. What does preventive care include?   A yearly physical exam. This is also called an annual well check.  Dental exams once or twice a year.  Routine eye exams. Ask your health care provider how often you should have your eyes checked.  Personal lifestyle choices, including: ? Daily care of your teeth and gums. ? Regular physical activity. ? Eating a healthy diet. ? Avoiding tobacco and drug use. ? Limiting alcohol use. ? Practicing safe sex. ? Taking vitamin and mineral supplements as recommended by your health care provider. What happens during an annual well check? The services and screenings done by your health care provider during your annual well check will depend on your age, overall health, lifestyle risk factors, and family history of disease. Counseling Your health care provider may ask you questions about your:  Alcohol use.  Tobacco use.  Drug use.  Emotional well-being.  Home and relationship well-being.  Sexual activity.  Eating habits.  Work and work Statistician.  Method of birth control.  Menstrual cycle.  Pregnancy history. Screening You may have the following tests or measurements:  Height, weight, and BMI.  Diabetes screening. This is done by checking your blood sugar (glucose) after you have not eaten for a while (fasting).  Blood pressure.  Lipid and cholesterol levels. These may be checked every 5 years starting at age 67.  Skin check.  Hepatitis C blood test.  Hepatitis B blood test.  Sexually transmitted disease (STD) testing.  BRCA-related cancer  screening. This may be done if you have a family history of breast, ovarian, tubal, or peritoneal cancers.  Pelvic exam and Pap test. This may be done every 3 years starting at age 36. Starting at age 66, this may be done every 5 years if you have a Pap test in combination with an HPV test. Discuss your test results, treatment options, and if necessary, the need for more tests with your health care provider. Vaccines Your health care provider may recommend certain vaccines, such as:  Influenza vaccine. This is recommended every year.  Tetanus, diphtheria, and acellular pertussis (Tdap, Td) vaccine. You may need a Td booster every 10 years.  Varicella vaccine. You may need this if you have not been vaccinated.  HPV vaccine. If you are 35 or younger, you may need three doses over 6 months.  Measles, mumps, and rubella (MMR) vaccine. You may need at least one dose of MMR. You may also need a second dose.  Pneumococcal 13-valent conjugate (PCV13) vaccine. You may need this if you have certain conditions and were not previously vaccinated.  Pneumococcal polysaccharide (PPSV23) vaccine. You may need one or two doses if you smoke cigarettes or if you have certain conditions.  Meningococcal vaccine. One dose is recommended if you are age 64-21 years and a first-year college student living in a residence hall, or if you have one of several medical conditions. You may also need additional booster doses.  Hepatitis A vaccine. You may need this if you have certain conditions or if you travel or work in places where you may be exposed to hepatitis A.  Hepatitis B vaccine. You may need this if you have certain conditions or if you travel or work in places where you may be exposed to hepatitis B.  Haemophilus influenzae type b (Hib) vaccine. You may need this if you have certain risk factors. Talk to your health care provider about which screenings and vaccines you need and how often you need  them. This information is not intended to replace advice given to you by your health care provider. Make sure you discuss any questions you have with your health care provider. Document Released: 02/17/2001 Document Revised: 08/04/2016 Document Reviewed: 10/23/2014 Elsevier Interactive Patient Education  2019 [employer].

## 2018-03-02 NOTE — Progress Notes (Signed)
Subjective:     Patient ID: Lynn Matthews , female    DOB: 13-Jul-1985 , 33 y.o.   MRN: [id number]   CC " Pt is here for complete physical with pap"  HPI Pt is here for complete physical with pap. Would like to get back on birth control, currently practicing withdraw method. LMP 5 weeks ago. Also in the past few months her face is getting some acne and dermatologist told her to mention this and maybe we can place her on birth control that is better for acne. Pt things she may have POS.    Past Medical History:  Diagnosis Date  . Anxiety   . Chronic pain   . Clotting disorder (HCC)   . Depression   . Kidney stones      Family History  Problem Relation Age of Onset  . Hyperlipidemia Mother   . Hypertension Father   . 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 G0P0. She is moving to another town and has quit working for [employer].   Current Outpatient Medications:  .  amphetamine-dextroamphetamine (ADDERALL) 20 MG tablet, Take 20 mg by mouth daily., Disp: , Rfl:  .  atenolol (TENORMIN) 12.5 mg TABS tablet, , Disp: , Rfl:  .  clonazePAM (KLONOPIN) 1 MG tablet, Take 1 mg by mouth daily., Disp: , Rfl:  .  Dapsone 5 % topical gel, Apply 1 application topically daily., Disp: 1 g, Rfl: 0 .  DULoxetine (CYMBALTA) 20 MG capsule, Take 20 mg by mouth daily., Disp: , Rfl:  .  Emollient (ROC RETINOL CORREXION) CREA, Starting three times  A week for now., Disp: 1 Tube, Rfl: 0 .  spironolactone (ALDACTONE) 25 MG tablet, Take 25 mg by mouth daily., Disp: , Rfl:    Allergies  Allergen Reactions  . Lamictal [Lamotrigine] Rash     Review of Systems  Constitutional: Negative for appetite change, chills, diaphoresis and fever.  HENT: Positive for congestion, postnasal drip, rhinorrhea and sneezing. Negative for ear discharge, ear pain, sinus pressure, sinus pain,  sore throat, trouble swallowing and voice change.   Eyes: Negative for discharge and visual disturbance.  Respiratory: Negative for cough, shortness of breath and wheezing.   Cardiovascular: Negative for chest pain, palpitations and leg swelling.  Gastrointestinal: Negative for abdominal pain, constipation, diarrhea, nausea and vomiting.  Endocrine: Negative for cold intolerance, heat intolerance, polydipsia and polyphagia.  Genitourinary: Positive for menstrual problem. Negative for dysuria, frequency, urgency and vaginal discharge.       Has irregular periods.   Musculoskeletal: Positive for back pain. Negative for joint swelling and myalgias.       Has scoliosis. Has sticking out bone on her tail bone, does not know if she ever injured herself.   Skin: Negative for rash and wound.  Allergic/Immunologic: Positive for environmental allergies.  Neurological: Negative for dizziness, tremors, weakness, numbness and headaches.  Hematological: Negative for adenopathy. Does not bruise/bleed easily.  Psychiatric/Behavioral: Negative for agitation, behavioral problems, confusion and sleep disturbance. The patient is nervous/anxious.      Today's Vitals   03/02/18 1223  BP: 118/78  Pulse: 94  Temp: 98.1 F (36.7 C)  TempSrc: Oral  SpO2: 98%  Weight: 127 lb 8 oz (57.8 kg)  Height: 5' 6.6" (1.692 m)   Body mass index is 20.21 kg/m.   Objective:  Physical Exam  BP 118/78 (BP Location: Left Arm, Patient Position: Sitting, Cuff Size: Normal)   Pulse 94   Temp 98.1 F (36.7 C) (Oral)   Ht 5' 6.6" (1.692 m)   Wt 127 lb 8 oz (57.8 kg)   SpO2 98%   BMI 20.21 kg/m   General Appearance:    Alert, anxious, cooperative, no distress, appears stated age  Head:    Normocephalic, without obvious abnormality, atraumatic  Eyes:    PERRL, conjunctiva/corneas clear, EOM's intact, fundi    benign, both eyes  Ears:    Normal TM's and external ear canals, both ears  Nose:   Nares normal, septum  midline, mucosa normal, no drainage    or sinus tenderness  Throat:   Lips, mucosa, and tongue normal; teeth and gums normal  Neck:   Supple, symmetrical, trachea midline, no adenopathy;    thyroid:  no enlargement/tenderness/nodules; no carotid   bruit  Back:     Symmetric, has mild lower thoracic scoliosis and prominent bony protrusion on her L tail bone, ROM normal, no CVA tenderness  Lungs:     Clear to auscultation bilaterally, respirations unlabored  Chest Wall:    No tenderness or deformity   Heart:    Regular rate and rhythm, S1 and S2 normal, no murmur, rub   or gallop  Breast Exam:    No tenderness, masses, or nipple abnormality  Abdomen:     Soft, non-tender, bowel sounds active all four quadrants,    no masses, no organomegaly  Genitalia:    Normal female without lesion, or tenderness. Has slight clear to white discharge in vaginal area. Cervix is pink, oss was a little friable when obtaining pap.       Extremities:   Extremities normal, atraumatic, no cyanosis or edema  Pulses:   2+ and symmetric all extremities  Skin:   Skin color, texture, turgor normal, no rashes or lesions. Has mild acne on sides of lower face.   Lymph nodes:   Cervical, supraclavicular, and axillary nodes normal  Neurologic:   CNII-XII intact, normal strength, sensation and reflexes    Throughout. Has normal Rhomberg, tandem gait, tip toe and heel gait, and nose to finger.        Assessment And Plan:  1. Screening for cervical cancer- screen - Cytology -Pap Smear  2. Screen for STD (sexually transmitted disease)- screen - Cervicovaginal ancillary only - HIV antibody (with reflex)  3. Routine medical exam- routine - CMP14 + Anion Gap - CBC no Diff - Lipid Profile - TSH - T4, Free - T3, free - POCT Urinalysis Dipstick (81002)  4. Screening for diabetes mellitus- screen - Hemoglobin A1c  5. Vitamin D deficiency- chronic - Vitamin D 1,25 Dihydroxy  6. Vitamin B12 deficiency- chronic -  Vitamin B12  7. Low serum alkaline phosphatase- past hx of this  8. Multiple food allergies- chronic.  - Allergens(96) Foods  9. Encounter for female birth control- acute. I refilled her OC's.  - drospirenone-ethinyl estradiol (YAZ) 3-0.02 MG tablet; Take 1 tablet by mouth daily.  Dispense: 1 Package; Refill: 11  10. Scoliosis of thoracic spine, unspecified scoliosis type- chronic. Advised to try seeing a chiropractor.     She showed me her labs on her phone:  Labs from 2017 B12 242 Vit D- 65.2 Lynn Hetland RODRIGUEZ-SOUTHWORTH, PA-C

## 2018-03-03 ENCOUNTER — Encounter: Payer: 59 | Admitting: Internal Medicine

## 2018-03-03 ENCOUNTER — Other Ambulatory Visit: Payer: Self-pay | Admitting: Internal Medicine

## 2018-03-03 DIAGNOSIS — R748 Abnormal levels of other serum enzymes: Secondary | ICD-10-CM | POA: Insufficient documentation

## 2018-03-03 DIAGNOSIS — Z91018 Allergy to other foods: Secondary | ICD-10-CM | POA: Insufficient documentation

## 2018-03-03 DIAGNOSIS — E538 Deficiency of other specified B group vitamins: Secondary | ICD-10-CM | POA: Insufficient documentation

## 2018-03-03 DIAGNOSIS — M419 Scoliosis, unspecified: Secondary | ICD-10-CM | POA: Insufficient documentation

## 2018-03-03 DIAGNOSIS — E559 Vitamin D deficiency, unspecified: Secondary | ICD-10-CM | POA: Insufficient documentation

## 2018-03-03 LAB — HIV ANTIBODY (ROUTINE TESTING W REFLEX): HIV SCREEN 4TH GENERATION: NONREACTIVE

## 2018-03-03 LAB — VITAMIN B12: VITAMIN B 12: 423 pg/mL (ref 232–1245)

## 2018-03-03 LAB — CBC
Hematocrit: 32.9 % — ABNORMAL LOW (ref 34.0–46.6)
Hemoglobin: 11.1 g/dL (ref 11.1–15.9)
MCH: 31.8 pg (ref 26.6–33.0)
MCHC: 33.7 g/dL (ref 31.5–35.7)
MCV: 94 fL (ref 79–97)
PLATELETS: 223 10*3/uL (ref 150–450)
RBC: 3.49 x10E6/uL — AB (ref 3.77–5.28)
RDW: 12.3 % (ref 11.7–15.4)
WBC: 7.1 10*3/uL (ref 3.4–10.8)

## 2018-03-03 LAB — CMP14 + ANION GAP
ALK PHOS: 38 IU/L — AB (ref 39–117)
ALT: 14 IU/L (ref 0–32)
ANION GAP: 14 mmol/L (ref 10.0–18.0)
AST: 21 IU/L (ref 0–40)
Albumin/Globulin Ratio: 2.5 — ABNORMAL HIGH (ref 1.2–2.2)
Albumin: 4.7 g/dL (ref 3.8–4.8)
BUN/Creatinine Ratio: 15 (ref 9–23)
BUN: 9 mg/dL (ref 6–20)
Bilirubin Total: 0.5 mg/dL (ref 0.0–1.2)
CO2: 24 mmol/L (ref 20–29)
CREATININE: 0.62 mg/dL (ref 0.57–1.00)
Calcium: 9.2 mg/dL (ref 8.7–10.2)
Chloride: 103 mmol/L (ref 96–106)
GFR calc Af Amer: 137 mL/min/{1.73_m2} (ref >59)
GFR calc non Af Amer: 119 mL/min/{1.73_m2} (ref >59)
GLOBULIN, TOTAL: 1.9 g/dL (ref 1.5–4.5)
Glucose: 83 mg/dL (ref 65–99)
POTASSIUM: 4 mmol/L (ref 3.5–5.2)
SODIUM: 141 mmol/L (ref 134–144)
Total Protein: 6.6 g/dL (ref 6.0–8.5)

## 2018-03-03 LAB — HEMOGLOBIN A1C
ESTIMATED AVERAGE GLUCOSE: 94 mg/dL
Hgb A1c MFr Bld: 4.9 % (ref 4.8–5.6)

## 2018-03-03 LAB — TSH: TSH: 2.44 u[IU]/mL (ref 0.450–4.500)

## 2018-03-03 LAB — LIPID PANEL
CHOLESTEROL TOTAL: 193 mg/dL (ref 100–199)
Chol/HDL Ratio: 2.1 ratio (ref 0.0–4.4)
HDL: 93 mg/dL (ref >39)
LDL Calculated: 91 mg/dL (ref 0–99)
TRIGLYCERIDES: 47 mg/dL (ref 0–149)
VLDL CHOLESTEROL CAL: 9 mg/dL (ref 5–40)

## 2018-03-03 LAB — T3, FREE: T3, Free: 3.4 pg/mL (ref 2.0–4.4)

## 2018-03-03 LAB — T4, FREE: Free T4: 1 ng/dL (ref 0.82–1.77)

## 2018-03-03 LAB — VITAMIN D 25 HYDROXY (VIT D DEFICIENCY, FRACTURES): VIT D 25 HYDROXY: 16.5 ng/mL — AB (ref 30.0–100.0)

## 2018-03-04 LAB — CYTOLOGY - PAP: DIAGNOSIS: NEGATIVE

## 2018-03-04 LAB — CERVICOVAGINAL ANCILLARY ONLY
BACTERIAL VAGINITIS: NEGATIVE
CANDIDA VAGINITIS: POSITIVE — AB
Chlamydia: NEGATIVE
Neisseria Gonorrhea: NEGATIVE
Trichomonas: POSITIVE — AB

## 2018-03-08 ENCOUNTER — Other Ambulatory Visit: Payer: Self-pay | Admitting: Internal Medicine

## 2018-03-08 ENCOUNTER — Encounter: Payer: Self-pay | Admitting: Family Medicine

## 2018-03-08 MED ORDER — METRONIDAZOLE 500 MG PO TABS: 500.0000 mg | ORAL_TABLET | Freq: Two times a day (BID) | ORAL | 0 refills | Status: AC

## 2018-03-08 MED ORDER — FLUCONAZOLE 150 MG PO TABS: 150.0000 mg | ORAL_TABLET | Freq: Every day | ORAL | 0 refills | Status: DC

## 2018-03-09 LAB — ALLERGENS(96) FOODS
Allergen Apple, IgE: <0.1 kU/L
Allergen Barley IgE: <0.1 kU/L
Allergen Black Pepper IgE: <0.1 kU/L
Allergen Broccoli: <0.1 kU/L
Allergen Cabbage IgE: <0.1 kU/L
Allergen Carrot IgE: <0.1 kU/L
Allergen Celery IgE: <0.1 kU/L
Allergen Cinnamon IgE: <0.1 kU/L
Allergen Grapefruit IgE: <0.1 kU/L
Allergen Green Bean IgE: <0.1 kU/L
Allergen Green Bell Pepper IgE: <0.1 kU/L
Allergen Lettuce IgE: <0.1 kU/L
Allergen Lime IgE: <0.1 kU/L
Allergen Melon IgE: <0.1 kU/L
Allergen Onion IgE: <0.1 kU/L
Allergen Pear IgE: <0.1 kU/L
Allergen Potato, White IgE: <0.1 kU/L
Allergen Salmon IgE: <0.1 kU/L
Allergen Strawberry IgE: <0.1 kU/L
Allergen Watermelon IgE: <0.1 kU/L
Basil: <0.1 kU/L
Beef IgE: <0.1 kU/L
Chocolate/Cacao IgE: <0.1 kU/L
Clam IgE: <0.1 kU/L
Cranberry IgE: <0.1 kU/L
Egg White IgE: <0.1 kU/L
F020-IgE Almond: <0.1 kU/L
F023-IgE Crab: <0.1 kU/L
F045-IgE Yeast: <0.1 kU/L
F078-IgE Casein: <0.1 kU/L
F089-IgE Mustard: <0.1 kU/L
F202-IgE Cashew Nut: <0.1 kU/L
F222-IgE Tea: <0.1 kU/L
F242-IgE Bing Cherry: <0.1 kU/L
F247-IgE Honey: 0.11 kU/L — AB
F261-IgE Asparagus: <0.1 kU/L
F262-IgE Eggplant: <0.1 kU/L
F265-IgE Cumin: <0.1 kU/L
F279-IgE Chili Pepper: <0.1 kU/L
F342-IgE Olive, Black: <0.1 kU/L
F343-IgE Raspberry: <0.1 kU/L
IgE Egg (Yolk): <0.1 kU/L
Kidney Bean IgE: <0.1 kU/L
Lemon: <0.1 kU/L
Mushroom IgE: <0.1 kU/L
Peanut IgE: <0.1 kU/L
Pineapple IgE: <0.1 kU/L
Pork IgE: <0.1 kU/L
Pumpkin IgE: <0.1 kU/L
Shrimp IgE: <0.1 kU/L
Soybean IgE: <0.1 kU/L
Vanilla: <0.1 kU/L
Walnut IgE: <0.1 kU/L
Whey: <0.1 kU/L

## 2018-03-09 LAB — SPECIMEN STATUS REPORT

## 2018-03-15 MED FILL — FLUCONAZOLE 150 MG TABS: 150 | 1 days supply | Qty: 1 | Fill #0

## 2018-03-15 MED FILL — AMPHETAMINE-DEXTROAMPHETAMI: 20 | 30 days supply | Qty: 60 | Fill #0

## 2018-03-15 MED FILL — DROSPIR-ETH ESTRA 3/.02 MG: 3-0.02 | 28 days supply | Qty: 28 | Fill #0 | Status: TO

## 2018-03-15 MED FILL — metroNIDAZOLE 500 MG TABS: 500 | 7 days supply | Qty: 14 | Fill #0

## 2018-04-16 ENCOUNTER — Other Ambulatory Visit: Payer: Self-pay | Admitting: Internal Medicine

## 2018-06-06 MED FILL — SPIRONOLACTONE 25 MG TABLET: 25 | 90 days supply | Qty: 90 | Fill #0

## 2019-02-25 ENCOUNTER — Other Ambulatory Visit: Payer: Self-pay | Admitting: Internal Medicine

## 2023-10-05 ENCOUNTER — Encounter: Payer: Self-pay | Admitting: Family Medicine

## 2023-10-05 ENCOUNTER — Ambulatory Visit: Admitting: Family Medicine

## 2023-10-05 VITALS — BP 132/68 | HR 125 | Temp 98.5°F | Resp 16 | Ht 65.0 in | Wt 107.8 lb

## 2023-10-05 DIAGNOSIS — R030 Elevated blood-pressure reading, without diagnosis of hypertension: Secondary | ICD-10-CM

## 2023-10-05 DIAGNOSIS — L819 Disorder of pigmentation, unspecified: Secondary | ICD-10-CM

## 2023-10-05 DIAGNOSIS — E282 Polycystic ovarian syndrome: Secondary | ICD-10-CM | POA: Diagnosis not present

## 2023-10-05 DIAGNOSIS — D649 Anemia, unspecified: Secondary | ICD-10-CM

## 2023-10-05 DIAGNOSIS — F900 Attention-deficit hyperactivity disorder, predominantly inattentive type: Secondary | ICD-10-CM

## 2023-10-05 DIAGNOSIS — F411 Generalized anxiety disorder: Secondary | ICD-10-CM | POA: Diagnosis not present

## 2023-10-05 DIAGNOSIS — E782 Mixed hyperlipidemia: Secondary | ICD-10-CM

## 2023-10-05 MED ORDER — SPIRONOLACTONE 25 MG PO TABS: 25.0000 mg | ORAL_TABLET | Freq: Every day | ORAL | 0 refills | Status: AC

## 2023-10-05 MED ORDER — AMPHETAMINE-DEXTROAMPHETAMINE 20 MG PO TABS: 20.0000 mg | ORAL_TABLET | Freq: Every day | ORAL | 0 refills | Status: AC

## 2023-10-05 MED ORDER — TRETINOIN 0.025 % EX CREA: TOPICAL_CREAM | Freq: Every day | CUTANEOUS | 0 refills | Status: DC

## 2023-10-05 NOTE — Progress Notes (Unsigned)
 New Patient Office Visit  Subjective    Patient ID: Lynn Matthews, female    DOB: 1985-05-09  Age: 38 y.o. MRN: [id number]  CC:  Chief Complaint  Patient presents with  . Establish Care    Lynn Matthews is a 38 year old female who presents to establish care and requesting medication management.  Cervical cancer screening and gynecologic symptoms - Last Pap smear performed over five years ago, with the most recent abnormal result in 2015 - History of a breast lump requiring follow-up - No current use of birth control for the past couple of years - Irregular menstrual periods and acne symptoms  Metabolic and hematologic findings - Recent hospital visit revealed elevated glucose at 100 mg/dL - Hemoglobin measured at 11.4 g/dL - Cholesterol slightly elevated - A1c within normal range at 4.8% - No formal diagnosis of iron deficiency anemia - Low meat consumption, which may contribute to low hemoglobin - Family history of diabetes (grandmother diagnosed)  Mental health symptoms and management - History of anxiety and ADHD - On Adderall since her twenties for ADHD - Valium discontinued during recent hospital stay - Anxious regarding medication management and seeking continued prescriptions - Completed TMS therapy and EMDR for mental health support - Currently engaging in meditation and life coaching - Seeking a new mental health provider for ongoing support  Polycystic ovary syndrome (pcos) and dermatologic symptoms - On spironolactone for PCOS and hypertension - Uses retinol cream for acne and hyperpigmentation  Outpatient Encounter Medications as of 10/05/2023  Medication Sig  . amphetamine-dextroamphetamine (ADDERALL) 20 MG tablet Take 20 mg by mouth daily.  SABRA spironolactone (ALDACTONE) 25 MG tablet Take 25 mg by mouth daily.  . [DISCONTINUED] atenolol (TENORMIN) 12.5 mg TABS tablet   . Dapsone  5 % topical gel Apply 1 application topically daily. (Patient not taking:  Reported on 10/05/2023)  . drospirenone -ethinyl estradiol  (YAZ) 3-0.02 MG tablet Take 1 tablet by mouth daily.  . DULoxetine (CYMBALTA) 20 MG capsule Take 20 mg by mouth daily.  . Emollient (ROC RETINOL CORREXION) CREA Starting three times  A week for now. (Patient not taking: Reported on 10/05/2023)  . Ergocalciferol  (VITAMIN D2) 50 MCG (2000 UT) TABS One qd (Patient not taking: Reported on 10/05/2023)  . fluconazole  (DIFLUCAN ) 150 MG tablet Take 1 tablet (150 mg total) by mouth daily. (Patient not taking: Reported on 10/05/2023)  . fluticasone  (FLONASE ) 50 MCG/ACT nasal spray Place 2 sprays into both nostrils daily. (Patient not taking: Reported on 10/05/2023)  . [DISCONTINUED] clonazePAM (KLONOPIN) 1 MG tablet Take 1 mg by mouth daily.   No facility-administered encounter medications on file as of 10/05/2023.    Past Medical History:  Diagnosis Date  . Allergy    Seasonal  . Anxiety   . Chronic pain   . Depression   . Hypertension   . Kidney stones     Past Surgical History:  Procedure Laterality Date  . BREAST LUMPECTOMY Right   . removal of kidney stone     via lithotripsy    Family History  Problem Relation Age of Onset  . Hyperlipidemia Mother   . Hypertension Father   . Heart disease Father   . Hyperlipidemia Brother   . Diabetes Maternal Grandfather   . Miscarriages / Stillbirths Maternal Grandmother   . Heart disease Paternal Grandfather   . Cancer Paternal Grandmother   . Miscarriages / Stillbirths Paternal Grandmother   . Miscarriages / Stillbirths Paternal Aunt   . Stroke Maternal Uncle   .  Stroke Maternal Uncle   . Heart disease Paternal Uncle     Social History   Socioeconomic History  . Marital status: Significant Other    Spouse name: Not on file  . Number of children: Not on file  . Years of education: Not on file  . Highest education level: Not on file  Occupational History  . Not on file  Tobacco Use  . Smoking status: Never  . Smokeless  tobacco: Never  Vaping Use  . Vaping status: Every Day  . Substances: Nicotine, THC, Flavoring  Substance and Sexual Activity  . Alcohol use: Yes    Comment: social use  . Drug use: No  . Sexual activity: Not Currently    Birth control/protection: None    Comment: withdraw method  Other Topics Concern  . Not on file  Social History Narrative  . Not on file   Social Drivers of Health   Financial Resource Strain: Not on file  Food Insecurity: Not on file  Transportation Needs: Not on file  Physical Activity: Not on file  Stress: Not on file  Social Connections: Unknown (03/25/2019)   [facility]   Social Connections   . Frequency of Communication with Friends and Family: Not asked   . Frequency of Social Gatherings with Friends and Family: Not asked  Intimate Partner Violence: Unknown (03/25/2019)   [facility] from [facility]   Intimate Partner Violence   . Fear of Current or Ex-Partner: Not asked   . Emotionally Abused: Not asked   . Physically Abused: Not asked   . Sexually Abused: Not asked    Review of Systems  Constitutional:  Negative for chills, fever and malaise/fatigue.  HENT:  Negative for congestion, ear pain, sinus pain and sore throat.   Eyes: Negative.   Respiratory:  Negative for cough, shortness of breath and wheezing.   Cardiovascular:  Negative for chest pain, palpitations and leg swelling.  Gastrointestinal:  Negative for constipation, diarrhea, nausea and vomiting.  Genitourinary:  Negative for dysuria, frequency and urgency.  Musculoskeletal: Negative.   Skin: Negative.   Neurological:  Negative for dizziness and headaches.  Endo/Heme/Allergies: Negative.   Psychiatric/Behavioral: Negative.          Objective    BP 132/68   Pulse (!) 125   Temp 98.5 F (36.9 C) (Temporal)   Resp 16   Ht 5' 5 (1.651 m)   Wt 107 lb 12.8 oz (48.9 kg)   LMP 07/19/2023 (Approximate)   SpO2 97%   BMI 17.94 kg/m   Physical Exam  {Labs  (Optional):23779}    Assessment & Plan:   Problem List Items Addressed This Visit   None   No follow-ups on file.   Jon Birmingham, CMA

## 2023-10-05 NOTE — Patient Instructions (Addendum)
 Clinical Follow-up (MH and/or SA Services):  Name of Provider Agency Referred: [facility] Date/Time of Appointment: Wednesday, 09/22/23 at 8:00am virtual appt. Details sent to patient's email. Phone: [phone] Fax: [phone] Address: [address] [facility] (near [employer] [facility]) [address]  Reason: Outpatient Counseling/Medication Management   [facility] Jade Montana ,  Regina Mozingo,

## 2023-10-06 DIAGNOSIS — D649 Anemia, unspecified: Secondary | ICD-10-CM | POA: Insufficient documentation

## 2023-10-06 DIAGNOSIS — E782 Mixed hyperlipidemia: Secondary | ICD-10-CM | POA: Insufficient documentation

## 2023-10-06 DIAGNOSIS — L819 Disorder of pigmentation, unspecified: Secondary | ICD-10-CM | POA: Insufficient documentation

## 2023-10-06 DIAGNOSIS — E282 Polycystic ovarian syndrome: Secondary | ICD-10-CM | POA: Insufficient documentation

## 2023-10-06 DIAGNOSIS — F900 Attention-deficit hyperactivity disorder, predominantly inattentive type: Secondary | ICD-10-CM | POA: Insufficient documentation

## 2023-10-06 DIAGNOSIS — R030 Elevated blood-pressure reading, without diagnosis of hypertension: Secondary | ICD-10-CM | POA: Insufficient documentation

## 2023-10-06 NOTE — Assessment & Plan Note (Signed)
 Attention-deficit hyperactivity disorder (ADHD) ADHD diagnosed in her twenties, primarily affecting focus. Prefers Adderall over Valium due to recent hospital discontinuation of Valium. Expressed anxiety about medication management due to recent changes in healthcare providers. - Prescribe Adderall for ADHD management. - Refer to a psychiatric specialist for ongoing management and medication adjustments. Side effects - none  Effective dose for daily functioning - PDMP REVIEWED TODAY.  Does not refill the medicine every 30 days. No suspicion for abuse or diversion or overuse.

## 2023-10-06 NOTE — Assessment & Plan Note (Addendum)
 History of PCOS with acne and post-inflammatory hyperpigmentation - Prescribe retinol cream for acne and hyperpigmentation.

## 2023-10-06 NOTE — Assessment & Plan Note (Addendum)
 Cholesterol levels slightly elevated. Discussed lifestyle modifications as a first-line approach before re-evaluation. High egg consumption may contribute to elevated cholesterol.  Labs drawn 09/04/23 @ [facility]  Total cholesterol - 223 LDL - 91  - Implement dietary changes to reduce cholesterol, focusing on reducing fatty foods and possibly egg consumption. - Re-evaluate cholesterol levels in three months.

## 2023-10-06 NOTE — Assessment & Plan Note (Addendum)
 Hemoglobin slightly low at 11.4. Differential includes microcytic anemia or thalassemia. Limited meat intake noted. Labs drawn on 09/04/23 @ [facility]  Hemoglobin 11.4 Hematocrit 33.1 - Consider dietary modifications to increase iron intake. - Consider B12 and folate labs at next visit

## 2023-10-06 NOTE — Assessment & Plan Note (Signed)
 Polycystic ovary syndrome (PCOS) with acne and post-inflammatory hyperpigmentation PCOS with associated acne and hyperpigmentation. Previously managed with spironolactone and topical retinol cream. Interested in resuming birth control for symptom management. - Prescribe spironolactone for PCOS management. - Prescribe retinol cream for acne and hyperpigmentation. - Plan to initiate birth control post-physical examination and pregnancy test.

## 2023-10-06 NOTE — Assessment & Plan Note (Addendum)
 Generalized anxiety disorder and depression Generalized anxiety disorder and depression managed with Abilify, but reports drowsiness as a side effect. Interested in finding a new mental health provider. Discussed previous experiences with TMS therapy and EMDR.    10/05/2023    3:06 PM  GAD 7 : Generalized Anxiety Score  Nervous, Anxious, on Edge 1  Control/stop worrying 0  Worry too much - different things 0  Trouble relaxing 1  Restless 1  Easily annoyed or irritable 0  Afraid - awful might happen 0  Total GAD 7 Score 3  Anxiety Difficulty Somewhat difficult  - Continue with CBT - Continue Abilify with current dosing adjustments. - History of taking Valium 2 mg as needed for anxiety and discussed seeing a specialist for prescription  - Refer to psychiatric specialists Regina Mozingo and Jade Montana  for evaluation and management of anxiety and depression.

## 2023-10-06 NOTE — Assessment & Plan Note (Signed)
 Elevated blood pressure Blood pressure slightly elevated. Spironolactone may assist in management due to its antihypertensive properties. BP Readings from Last 3 Encounters:  10/05/23 132/68  03/02/18 118/78  03/01/18 132/72    - Continue spironolactone as it may aid in blood pressure control.

## 2023-10-12 ENCOUNTER — Encounter: Admitting: Family Medicine

## 2023-10-18 ENCOUNTER — Telehealth: Payer: Self-pay

## 2023-10-18 NOTE — Telephone Encounter (Signed)
 Copied from CRM [id number]. Topic: Clinical - Medication Question >> Oct 18, 2023 12:08 PM Delon DASEN wrote: Reason for CRM: Dr Dorien has discontinued the Adderall, he feels it is causing psychosis, this will be in the report- other options can be discussed with patient- [phone] for any questions ask for Dr Dorien or Dr Rosalea

## 2023-10-20 ENCOUNTER — Telehealth: Payer: Self-pay

## 2023-10-20 NOTE — Transitions of Care (Post Inpatient/ED Visit) (Signed)
   10/20/2023  Name: Lynn Matthews MRN: [id number] DOB: 26-Jun-1985  Today's TOC FU Call Status: Today's TOC FU Call Status:: Unsuccessful Call (1st Attempt) Unsuccessful Call (1st Attempt) Date: 10/20/23  Attempted to reach the patient regarding the most recent Inpatient/ED visit.  Follow Up Plan: Additional outreach attempts will be made to reach the patient to complete the Transitions of Care (Post Inpatient/ED visit) call.   Signature Julian Lemmings, LPN [facility] Nurse Health Advisor Direct Dial [phone]

## 2023-10-21 NOTE — Transitions of Care (Post Inpatient/ED Visit) (Signed)
   10/21/2023  Name: Lynn Matthews MRN: [id number] DOB: Aug 10, 1985  Today's TOC FU Call Status: Today's TOC FU Call Status:: Unsuccessful Call (2nd Attempt) Unsuccessful Call (1st Attempt) Date: 10/20/23 Unsuccessful Call (2nd Attempt) Date: 10/21/23  Attempted to reach the patient regarding the most recent Inpatient/ED visit.  Follow Up Plan: Additional outreach attempts will be made to reach the patient to complete the Transitions of Care (Post Inpatient/ED visit) call.   Signature Julian Lemmings, LPN [facility] Nurse Health Advisor Direct Dial [phone]

## 2023-10-22 NOTE — Transitions of Care (Post Inpatient/ED Visit) (Signed)
   10/22/2023  Name: Lynn Matthews MRN: [id number] DOB: December 28, 1985  Today's TOC FU Call Status: Today's TOC FU Call Status:: Successful TOC FU Call Completed Unsuccessful Call (1st Attempt) Date: 10/20/23 Unsuccessful Call (2nd Attempt) Date: 10/21/23 [facility] FU Call Complete Date: 10/22/23 Patient's Name and Date of Birth confirmed.  Transition Care Management Follow-up Telephone Call Date of Discharge: 10/19/23 Discharge Facility: Other [facility]) Name of Other (Non[facility]) Discharge Facility: [facility] Type of Discharge: Inpatient Admission Primary Inpatient Discharge Diagnosis:: depression How have you been since you were released from the hospital?: Better Any questions or concerns?: No  Items Reviewed: Did you receive and understand the discharge instructions provided?: Yes Medications obtained,verified, and reconciled?: Yes (Medications Reviewed) Any new allergies since your discharge?: No Dietary orders reviewed?: Yes Do you have support at home?: Yes People in Home [RPT]: parent(s)  Medications Reviewed Today: Medications Reviewed Today     Reviewed by Emmitt Pan, LPN (Licensed Practical Nurse) on 10/22/23 at 1047  Med List Status: <None>   Medication Order Taking? Sig Documenting Provider Last Dose Status Informant  amphetamine-dextroamphetamine (ADDERALL) 20 MG tablet [id number] Yes Take 1 tablet (20 mg total) by mouth daily. Teressa Harrie HERO, FNP  Active   ARIPiprazole (ABILIFY) 10 MG tablet [id number] Yes Take 10 mg by mouth daily. [provider]  Active   magnesium oxide (MAG-OX) 400 (240 Mg) MG tablet [id number] Yes Take 400 mg by mouth daily. [provider]  Active   nicotine (NICODERM CQ - DOSED IN MG/24 HOURS) 14 mg/24hr patch [id number] Yes Place 14 mg onto the skin daily. [provider]  Active   prazosin (MINIPRESS) 1 MG capsule [id number] Yes Take 1 mg by mouth at bedtime. [provider]  Active   sertraline  (ZOLOFT) 50 MG tablet [id number] Yes Take 50 mg by mouth daily. [provider]  Active   spironolactone (ALDACTONE) 25 MG tablet [id number] Yes Take 1 tablet (25 mg total) by mouth daily. Teressa Harrie HERO, FNP  Active   tretinoin (RETIN-A) 0.025 % cream [id number] Yes Apply topically at bedtime. Teressa Harrie HERO, FNP  Active             Home Care and Equipment/Supplies: Were Home Health Services Ordered?: NA Any new equipment or medical supplies ordered?: NA  Functional Questionnaire: Do you need assistance with bathing/showering or dressing?: No Do you need assistance with meal preparation?: No Do you need assistance with eating?: No Do you have difficulty maintaining continence: No Do you need assistance with getting out of bed/getting out of a chair/moving?: No Do you have difficulty managing or taking your medications?: No  Follow up appointments reviewed: PCP Follow-up appointment confirmed?: Yes Date of PCP follow-up appointment?: 10/28/23 Follow-up Provider: [facility] Follow-up appointment confirmed?: Yes Date of Specialist follow-up appointment?: 10/25/23 Follow-Up Specialty Provider:: [facility] Do you need transportation to your follow-up appointment?: No Do you understand care options if your condition(s) worsen?: Yes-patient verbalized understanding    SIGNATURE Pan Emmitt, LPN [facility] Nurse Health Advisor Direct Dial [phone]

## 2023-10-28 ENCOUNTER — Encounter: Payer: Self-pay | Admitting: Family Medicine

## 2023-10-28 ENCOUNTER — Telehealth: Payer: Self-pay | Admitting: Family Medicine

## 2023-10-28 ENCOUNTER — Other Ambulatory Visit: Payer: Self-pay | Admitting: Family Medicine

## 2023-10-28 ENCOUNTER — Ambulatory Visit (INDEPENDENT_AMBULATORY_CARE_PROVIDER_SITE_OTHER): Admitting: Family Medicine

## 2023-10-28 DIAGNOSIS — D649 Anemia, unspecified: Secondary | ICD-10-CM | POA: Diagnosis not present

## 2023-10-28 DIAGNOSIS — F900 Attention-deficit hyperactivity disorder, predominantly inattentive type: Secondary | ICD-10-CM

## 2023-10-28 DIAGNOSIS — Z111 Encounter for screening for respiratory tuberculosis: Secondary | ICD-10-CM

## 2023-10-28 DIAGNOSIS — N289 Disorder of kidney and ureter, unspecified: Secondary | ICD-10-CM | POA: Diagnosis not present

## 2023-10-28 MED ORDER — VILOXAZINE HCL ER 200 MG PO CP24: 200.0000 mg | ORAL_CAPSULE | Freq: Every day | ORAL | 0 refills | Status: DC

## 2023-10-28 NOTE — Patient Instructions (Signed)
  VISIT SUMMARY: You had a follow-up appointment today to manage your medications and discuss your recent hospital discharge. We reviewed your psychiatric symptoms, stimulant use, and recent hematologic and electrolyte abnormalities. We also discussed your preventive health needs and reproductive health interests.  YOUR PLAN: -ATTENTION-DEFICIT HYPERACTIVITY DISORDER (ADHD): ADHD is a condition characterized by symptoms of inattention, hyperactivity, and impulsivity. We are prescribing Qelbree as an alternative medication to manage your ADHD symptoms. Please follow up next week for a physical and drug test.  -CANNABIS-INDUCED PSYCHOTIC DISORDER: This condition involves psychotic symptoms triggered by the use of synthetic marijuana. Your symptoms have improved since you stopped using synthetic marijuana.  -RENAL FUNCTION ABNORMALITY (ELEVATED CREATININE): Elevated creatinine can indicate kidney issues. We will recheck your basic metabolic panel (BMP) to assess your kidney function, including creatinine levels.  -HYPOMAGNESEMIA: Hypomagnesemia means low magnesium levels in the blood. You have been on magnesium supplements since October 19, 2023. We will recheck your magnesium levels.  -ANEMIA, UNSPECIFIED: Anemia is a condition where you have a lower than normal number of red blood cells. We will order iron studies to evaluate the cause of your anemia.  INSTRUCTIONS: Please follow up next week for a physical and drug test. We will also recheck your basic metabolic panel (BMP) to assess your kidney function and magnesium levels. Additionally, we will conduct iron studies to evaluate your anemia.                      Contains text generated by Abridge.                                 Contains text generated by Abridge.

## 2023-10-28 NOTE — Progress Notes (Unsigned)
 Subjective:  Patient ID: Lynn Matthews, female    DOB: 09-16-1985  Age: 38 y.o. MRN: [id number]  Chief Complaint  Patient presents with   Hospitalization Follow-up   Discussed the use of AI scribe software for clinical note transcription with the patient, who gave verbal consent to proceed.  History of Present Illness   Lynn Matthews is a 38 year old female with a history of psychosis who presents for medication management and follow-up after a recent hospital discharge.  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 - No history of psychosis related to Adderall; believes symptoms would have manifested earlier if Adderall was the cause - Recent psychiatric hospitalization after being found sleeping on the floor due to discomfort with mattress softness - Currently awaiting psychiatric medication management appointment at the [facility] in December - Experiencing significant psychosocial stressors: eviction, job loss, relationship termination, and a traumatic event - Recent relocation from   to [place] ; currently residing with parents - Frustration with parents' disapproval of medication use and their traditional views  Stimulant use and preferences - Chronic Adderall use at 90 mg daily in three divided doses - Interested in switching to Vyvanse if Adderall is discontinued - Recent hospitalization, Dr. Dorien requested that not take Adderall due to several psychosis admissions  Hematologic and electrolyte abnormalities - Recent hospitalization revealed low magnesium and hemoglobin levels - Initiated on magnesium supplementation - Uncertain if iron studies were performed; open to iron studies today - Willing to have creatinine rechecked  Preventive health and employment requirements - Seeking employment in a correctional facility in [place]; requires  completion of a physical form for job application - Needs health screenings including TB, varicella, and MMR titers - Declines influenza vaccination  Reproductive health - Interested in initiating birth control - Considering pregnancy testing          10/28/2023    2:17 PM 10/05/2023    3:06 PM 03/02/2018   12:26 PM 03/01/2018    2:36 PM  Depression screen PHQ 2/9  Decreased Interest 2 1 3 3   Down, Depressed, Hopeless 1 0 2 1  PHQ - 2 Score 3 1 5 4   Altered sleeping 0 1 2 2   Tired, decreased energy 1 1 2 2   Change in appetite 0 0 2 2  Feeling bad or failure about yourself  0 0 2 2  Trouble concentrating 3 3 2 2   Moving slowly or fidgety/restless 0 0 2 2  Suicidal thoughts 0 0 0 0  PHQ-9 Score 7 6 17 16   Difficult doing work/chores Somewhat difficult Somewhat difficult Not difficult at all Extremely dIfficult        10/28/2023    2:17 PM  Fall Risk   Falls in the past year? 0  Number falls in past yr: 0  Injury with Fall? 0  Risk for fall due to : No Fall Risks  Follow up Falls evaluation completed    Patient Care Team: Teressa Harrie HERO, FNP as PCP - General (Family Medicine) Maurice Loving, MD as Referring Physician (Psychiatry) Lenon Reda BIRCH, PA-C (Physician Assistant)   Review of Systems  Constitutional:  Negative for chills, diaphoresis, fatigue and fever.  HENT:  Negative for congestion, ear pain and sinus pain.   Respiratory:  Negative for cough and shortness of breath.   Cardiovascular:  Negative for chest pain.  Gastrointestinal:  Negative for abdominal pain, constipation, nausea and vomiting.  Genitourinary:  Negative for dysuria.  Musculoskeletal:  Negative for arthralgias.  Neurological:  Negative for weakness and headaches.  Psychiatric/Behavioral:  Positive for dysphoric mood. The patient is not nervous/anxious.     Current Outpatient Medications on File Prior to Visit  Medication Sig Dispense Refill   amphetamine-dextroamphetamine (ADDERALL) 20 MG  tablet Take 1 tablet (20 mg total) by mouth daily. 30 tablet 0   ARIPiprazole (ABILIFY) 10 MG tablet Take 10 mg by mouth daily.     magnesium oxide (MAG-OX) 400 (240 Mg) MG tablet Take 400 mg by mouth daily.     prazosin (MINIPRESS) 1 MG capsule Take 1 mg by mouth at bedtime.     sertraline (ZOLOFT) 50 MG tablet Take 50 mg by mouth daily.     spironolactone (ALDACTONE) 25 MG tablet Take 1 tablet (25 mg total) by mouth daily. 90 tablet 0   nicotine (NICODERM CQ - DOSED IN MG/24 HOURS) 14 mg/24hr patch Place 14 mg onto the skin daily. (Patient not taking: Reported on 10/28/2023)     tretinoin (RETIN-A) 0.025 % cream Apply topically at bedtime. (Patient not taking: Reported on 10/28/2023) 45 g 0   No current facility-administered medications on file prior to visit.   Past Medical History:  Diagnosis Date   Allergy    Seasonal   Anxiety    Chronic pain    Depression    Hypertension    Kidney stones    Past Surgical History:  Procedure Laterality Date   BREAST LUMPECTOMY Right    removal of kidney stone     via lithotripsy    Family History  Problem Relation Age of Onset   Hyperlipidemia Mother    Hypertension Father    Heart disease Father    Hyperlipidemia Brother    Diabetes Maternal Grandfather    Miscarriages / Stillbirths Maternal Grandmother    Heart disease Paternal Grandfather    Cancer Paternal Grandmother    Miscarriages / Stillbirths Paternal Grandmother    Miscarriages / Stillbirths Paternal Aunt    Stroke Maternal Uncle    Stroke Maternal Uncle    Heart disease Paternal Uncle    Social History   Socioeconomic History   Marital status: Significant Other    Spouse name: Not on file   Number of children: Not on file   Years of education: Not on file   Highest education level: Not on file  Occupational History   Not on file  Tobacco Use   Smoking status: Never   Smokeless tobacco: Never  Vaping Use   Vaping status: Every Day   Substances: Nicotine, THC,  Flavoring  Substance and Sexual Activity   Alcohol use: Yes    Comment: social use   Drug use: No   Sexual activity: Not Currently    Birth control/protection: None    Comment: withdraw method  Other Topics Concern   Not on file  Social History Narrative   Not on file   Social Drivers of Health   Financial Resource Strain: Not on file  Food Insecurity: Low Risk  (10/14/2023)   Received from [facility]   Hunger Vital Sign    Within the past 12 months, you worried that your food would run out before you got money to buy more: Never true    Within the past 12 months, the food you bought just didn't last and you didn't have money to get more. : Never true  Transportation Needs: Unmet Transportation Needs (10/14/2023)  Received from [facility]    In the past 12 months, has lack of reliable transportation kept you from medical appointments, meetings, work or from getting things needed for daily living? : Yes  Physical Activity: Not on file  Stress: Not on file  Social Connections: Unknown (03/25/2019)   [facility]   Social Connections    Frequency of Communication with Friends and Family: Not asked    Frequency of Social Gatherings with Friends and Family: Not asked    Objective:  BP 124/72   Pulse 71   Temp 98.4 F (36.9 C) (Temporal)   Resp 16   Ht 5' 5 (1.651 m)   Wt 115 lb 12.8 oz (52.5 kg)   LMP 07/19/2023 (Approximate)   SpO2 100%   BMI 19.27 kg/m      10/28/2023    2:10 PM 10/05/2023    1:53 PM 03/02/2018   12:23 PM  BP/Weight  Systolic BP 124 132 118  Diastolic BP 72 68 78  Wt. (Lbs) 115.8 107.8 127.5  BMI 19.27 kg/m2 17.94 kg/m2 20.21 kg/m2    Physical Exam Constitutional:      General: She is not in acute distress.    Appearance: Normal appearance. She is normal weight. She is not ill-appearing.  Eyes:     Conjunctiva/sclera: Conjunctivae normal.  Cardiovascular:     Rate and Rhythm: Normal rate and regular  rhythm.     Heart sounds: Normal heart sounds. No murmur heard. Pulmonary:     Effort: Pulmonary effort is normal.     Breath sounds: Normal breath sounds. No wheezing.  Abdominal:     General: Bowel sounds are normal.     Palpations: Abdomen is soft.  Neurological:     Mental Status: She is alert. Mental status is at baseline.  Psychiatric:        Mood and Affect: Mood normal.        Behavior: Behavior normal.     Lab Results  Component Value Date   WBC 7.1 03/02/2018   HGB 11.1 03/02/2018   HCT 32.9 (L) 03/02/2018   PLT 223 03/02/2018   GLUCOSE 83 03/02/2018   CHOL 193 03/02/2018   TRIG 47 03/02/2018   HDL 93 03/02/2018   LDLCALC 91 03/02/2018   ALT 14 03/02/2018   AST 21 03/02/2018   NA 141 03/02/2018   K 4.0 03/02/2018   CL 103 03/02/2018   CREATININE 0.62 03/02/2018   BUN 9 03/02/2018   CO2 24 03/02/2018   TSH 2.440 03/02/2018   INR 0.95 10/18/2015   HGBA1C 4.9 03/02/2018    Results for orders placed or performed in visit on 03/02/18  Cytology -Pap Smear   Collection Time: 03/02/18 12:00 AM  Result Value Ref Range   Adequacy      Satisfactory for evaluation  endocervical/transformation zone component PRESENT.   Diagnosis      NEGATIVE FOR INTRAEPITHELIAL LESIONS OR MALIGNANCY.   Diagnosis      FUNGAL ORGANISMS PRESENT CONSISTENT WITH CANDIDA SPP.   Material Submitted CervicoVaginal Pap [ThinPrep Imaged]   Cervicovaginal ancillary only   Collection Time: 03/02/18 12:00 AM  Result Value Ref Range   Bacterial vaginitis Negative for Bacterial Vaginitis Microorganisms    Candida vaginitis **POSITIVE for Candida species** (A)    Chlamydia Negative    Neisseria Gonorrhea Negative    Trichomonas **POSITIVE** (A)   HIV antibody (with reflex)   Collection Time: 03/02/18  2:35 PM  Result Value Ref Range   HIV Screen 4th Generation wRfx Non Reactive Non Reactive  CMP14 + Anion Gap   Collection Time: 03/02/18  2:35 PM  Result Value Ref Range   Glucose 83 65 -  99 mg/dL   BUN 9 6 - 20 mg/dL   Creatinine, Ser 9.37 0.57 - 1.00 mg/dL   GFR calc non Af Amer 119 >59 mL/min/1.73   GFR calc Af Amer 137 >59 mL/min/1.73   BUN/Creatinine Ratio 15 9 - 23   Sodium 141 134 - 144 mmol/L   Potassium 4.0 3.5 - 5.2 mmol/L   Chloride 103 96 - 106 mmol/L   CO2 24 20 - 29 mmol/L   Anion Gap 14.0 10.0 - 18.0 mmol/L   Calcium 9.2 8.7 - 10.2 mg/dL   Total Protein 6.6 6.0 - 8.5 g/dL   Albumin 4.7 3.8 - 4.8 g/dL   Globulin, Total 1.9 1.5 - 4.5 g/dL   Albumin/Globulin Ratio 2.5 (H) 1.2 - 2.2   Bilirubin Total 0.5 0.0 - 1.2 mg/dL   Alkaline Phosphatase 38 (L) 39 - 117 IU/L   AST 21 0 - 40 IU/L   ALT 14 0 - 32 IU/L  CBC no Diff   Collection Time: 03/02/18  2:35 PM  Result Value Ref Range   WBC 7.1 3.4 - 10.8 x10E3/uL   RBC 3.49 (L) 3.77 - 5.28 x10E6/uL   Hemoglobin 11.1 11.1 - 15.9 g/dL   Hematocrit 67.0 (L) 65.9 - 46.6 %   MCV 94 79 - 97 fL   MCH 31.8 26.6 - 33.0 pg   MCHC 33.7 31.5 - 35.7 g/dL   RDW 87.6 88.2 - 84.5 %   Platelets 223 150 - 450 x10E3/uL  Lipid Profile   Collection Time: 03/02/18  2:35 PM  Result Value Ref Range   Cholesterol, Total 193 100 - 199 mg/dL   Triglycerides 47 0 - 149 mg/dL   HDL 93 >60 mg/dL   VLDL Cholesterol Cal 9 5 - 40 mg/dL   LDL Calculated 91 0 - 99 mg/dL   Chol/HDL Ratio 2.1 0.0 - 4.4 ratio  TSH   Collection Time: 03/02/18  2:35 PM  Result Value Ref Range   TSH 2.440 0.450 - 4.500 uIU/mL  T4, Free   Collection Time: 03/02/18  2:35 PM  Result Value Ref Range   Free T4 1.00 0.82 - 1.77 ng/dL  T3, free   Collection Time: 03/02/18  2:35 PM  Result Value Ref Range   T3, Free 3.4 2.0 - 4.4 pg/mL  Hemoglobin A1c   Collection Time: 03/02/18  2:35 PM  Result Value Ref Range   Hgb A1c MFr Bld 4.9 4.8 - 5.6 %   Est. average glucose Bld gHb Est-mCnc 94 mg/dL  Vitamin B12   Collection Time: 03/02/18  2:35 PM  Result Value Ref Range   Vitamin B-12 423 232 - 1,245 pg/mL  VITAMIN D  25 Hydroxy (Vit-D Deficiency,  Fractures)   Collection Time: 03/02/18  2:35 PM  Result Value Ref Range   Vit D, 25-Hydroxy 16.5 (L) 30.0 - 100.0 ng/mL  Allergens(96) Foods   Collection Time: 03/02/18  2:35 PM  Result Value Ref Range   Class Description Allergens Comment    F020-IgE Almond <0.10 Class 0 kU/L   Allergen Apple, IgE <0.10 Class 0 kU/L   F261-IgE Asparagus <0.10 Class 0 kU/L   F096-IgE Avocado <0.10 Class 0 kU/L   Allergen Banana IgE <0.10 Class 0 kU/L   Allergen Barley  IgE <0.10 Class 0 kU/L   Basil <0.10 Class 0 kU/L   F278-IgE Bayleaf (Laurel) <0.10 Class 0 kU/L   Allergen Green Bean IgE <0.10 Class 0 kU/L   Lima Bean IgE CANCELED kU/L   White Bean IgE <0.10 Class 0 kU/L   Beef IgE <0.10 Class 0 kU/L   Red Beet <0.10 Class 0 kU/L   Allergen Blueberry IgE <0.10 Class 0 kU/L   Allergen Broccoli <0.10 Class 0 kU/L   Allergen Cabbage IgE <0.10 Class 0 kU/L   Allergen Melon IgE <0.10 Class 0 kU/L   Allergen Carrot IgE <0.10 Class 0 kU/L   F078-IgE Casein <0.10 Class 0 kU/L   F202-IgE Cashew Nut <0.10 Class 0 kU/L   Allergen Cauliflower IgE <0.10 Class 0 kU/L   Allergen Celery IgE <0.10 Class 0 kU/L   F081-IgE Cheese, Cheddar Type <0.10 Class 0 kU/L   Chicken IgE <0.10 Class 0 kU/L   Allergen Cinnamon IgE <0.10 Class 0 kU/L   Clam IgE <0.10 Class 0 kU/L   Chocolate/Cacao IgE <0.10 Class 0 kU/L   Allergen Coconut IgE <0.10 Class 0 kU/L   Codfish IgE <0.10 Class 0 kU/L   Coffee <0.10 Class 0 kU/L   Allergen Corn, IgE <0.10 Class 0 kU/L   F023-IgE Crab <0.10 Class 0 kU/L   Allergen Cucumber IgE <0.10 Class 0 kU/L   F077-IgE Beta Lactoglobulin <0.10 Class 0 kU/L   F262-IgE Eggplant <0.10 Class 0 kU/L   Egg White IgE <0.10 Class 0 kU/L   IgE Egg (Yolk) <0.10 Class 0 kU/L   Allergen Garlic IgE <0.10 Class 0 kU/L   Allergen Ginger IgE <0.10 Class 0 kU/L   Allergen Gluten IgE <0.10 Class 0 kU/L   Allergen Grape IgE <0.10 Class 0 kU/L   Allergen Grapefruit IgE <0.10 Class 0 kU/L   F247-IgE Honey  0.11 (A) Class 0/I kU/L   Allergen Lamb IgE <0.10 Class 0 kU/L   Lemon <0.10 Class 0 kU/L   Allergen Lime IgE <0.10 Class 0 kU/L   Allergen Lettuce IgE <0.10 Class 0 kU/L   F080-IgE Lobster <0.10 Class 0 kU/L   Malt <0.10 Class 0 kU/L   F076-IgE Alpha Lactalbumin <0.10 Class 0 kU/L   F300-IgE Goat's Milk <0.10 Class 0 kU/L   Mushroom IgE <0.10 Class 0 kU/L   F089-IgE Mustard <0.10 Class 0 kU/L   Allergen Oat IgE <0.10 Class 0 kU/L   F342-IgE Olive, Black <0.10 Class 0 kU/L   Allergen Onion IgE <0.10 Class 0 kU/L   Orange <0.10 Class 0 kU/L   F283-IgE Oregano <0.10 Class 0 kU/L   Allergen Green Pea IgE <0.10 Class 0 kU/L   Allergen, Peach f95 <0.10 Class 0 kU/L   Peanut IgE <0.10 Class 0 kU/L   Allergen Pear IgE <0.10 Class 0 kU/L   Allergen Black Pepper IgE <0.10 Class 0 kU/L   F279-IgE Chili Pepper <0.10 Class 0 kU/L   Allergen Green Bell Pepper IgE <0.10 Class 0 kU/L   Pineapple IgE <0.10 Class 0 kU/L   Pork IgE <0.10 Class 0 kU/L   Allergen Sweet Potato IgE <0.10 Class 0 kU/L   Allergen Potato, White IgE <0.10 Class 0 kU/L   Allergen Rice IgE <0.10 Class 0 kU/L   C074-IgE Gelatin <0.10 Class 0 kU/L   F343-IgE Raspberry <0.10 Class 0 kU/L   Kidney Bean IgE <0.10 Class 0 kU/L   Hops <0.10 Class 0 kU/L   Cranberry IgE <0.10 Class 0  kU/L   F265-IgE Cumin <0.10 Class 0 kU/L   Vanilla <0.10 Class 0 kU/L   Rye IgE <0.10 Class 0 kU/L   Allergen Salmon IgE <0.10 Class 0 kU/L   Scallop IgE <0.10 Class 0 kU/L   Sesame Seed IgE <0.10 Class 0 kU/L   Shrimp IgE <0.10 Class 0 kU/L   Soybean IgE <0.10 Class 0 kU/L   F214-IgE Spinach <0.10 Class 0 kU/L   Pumpkin IgE <0.10 Class 0 kU/L   Allergen Strawberry IgE <0.10 Class 0 kU/L   F242-IgE Bing Cherry <0.10 Class 0 kU/L   F222-IgE Tea <0.10 Class 0 kU/L   Allergen Tomato, IgE <0.10 Class 0 kU/L   Tuna <0.10 Class 0 kU/L   Allergen Mal[place] IgE <0.10 Class 0 kU/L   Walnut IgE <0.10 Class 0 kU/L   Allergen Watermelon IgE <0.10 Class 0  kU/L   Wheat IgE <0.10 Class 0 kU/L   Whey <0.10 Class 0 kU/L   F045-IgE Yeast <0.10 Class 0 kU/L  Specimen status report   Collection Time: 03/02/18  2:35 PM  Result Value Ref Range   specimen status report Comment   POCT Urinalysis Dipstick (18997)   Collection Time: 03/02/18  4:57 PM  Result Value Ref Range   Color, UA yellow    Clarity, UA clear    Glucose, UA Negative Negative   Bilirubin, UA negative    Ketones, UA negative    Spec Grav, UA 1.020 1.010 - 1.025   Blood, UA negative    pH, UA 7.0 5.0 - 8.0   Protein, UA Negative Negative   Urobilinogen, UA 0.2 0.2 or 1.0 E.U./dL   Nitrite, UA negative    Leukocytes, UA Negative Negative   Appearance     Odor    .  Assessment & Plan:   Assessment & Plan Hypomagnesemia Hypomagnesemia Low magnesium levels identified, on supplementation since October 19, 2023. - Recheck magnesium levels. Orders:   Magnesium  Anemia, unspecified type Anemia, unspecified Low hemoglobin noted, no prior iron studies conducted. - Order iron studies to evaluate anemia.     Orders:   Iron, TIBC and Ferritin Panel  Function kidney decreased Renal function abnormality (elevated creatinine) Elevated creatinine noted, GFR normal. She was not informed of specific levels. - Recheck BMP to assess renal function, including creatinine levels. Orders:   Comprehensive metabolic panel with GFR  Attention deficit hyperactivity disorder (ADHD), predominantly inattentive type Attention-deficit hyperactivity disorder (ADHD) ADHD management complicated by potential Adderall-induced psychosis per Dr. Dorien at the hospital versus cannabis-induced psychotic disorder. Seeking alternative medication. - Prescribe Qelbree as alternative ADHD medication. - Schedule follow-up for physical and drug test next week. - Psychiatry appointment for treatment in December, initial new patient appointment completed    Screening-pulmonary TB Needs for  work Orders:   QuantiFERON-TB Gold Plus    Follow-up: Return in about 1 week (around 11/04/2023) for med check, lab visit .  An After Visit Summary was printed and given to the patient.  Harrie Cedar, FNP [facility] [phone]

## 2023-10-28 NOTE — Telephone Encounter (Signed)
 Copied from CRM #[id number]. Topic: Clinical - Prescription Issue >> Oct 28, 2023  4:09 PM Antwanette L wrote: Reason for CRM: The patient called to report that their insurance will not cover Viloxazine ER (Qelbree) 200 mg, 24-hour capsule.The provider needs to send an alternative medication to [facility] [address] [place] [address]. The patient can be contacted at [phone]

## 2023-10-28 NOTE — Assessment & Plan Note (Signed)
  Orders:   viloxazine ER (QELBREE) 200 MG 24 hr capsule; Take 1 capsule (200 mg total) by mouth daily.

## 2023-10-29 DIAGNOSIS — Z111 Encounter for screening for respiratory tuberculosis: Secondary | ICD-10-CM | POA: Insufficient documentation

## 2023-10-29 DIAGNOSIS — N289 Disorder of kidney and ureter, unspecified: Secondary | ICD-10-CM | POA: Insufficient documentation

## 2023-10-29 DIAGNOSIS — D649 Anemia, unspecified: Secondary | ICD-10-CM | POA: Insufficient documentation

## 2023-10-29 NOTE — Assessment & Plan Note (Addendum)
 Hypomagnesemia Low magnesium levels identified, on supplementation since October 19, 2023. - Recheck magnesium levels. Orders:   Magnesium

## 2023-10-29 NOTE — Assessment & Plan Note (Addendum)
 Renal function abnormality (elevated creatinine) Elevated creatinine noted, GFR normal. She was not informed of specific levels. - Recheck BMP to assess renal function, including creatinine levels. Orders:   Comprehensive metabolic panel with GFR

## 2023-10-29 NOTE — Assessment & Plan Note (Addendum)
 Anemia, unspecified Low hemoglobin noted, no prior iron studies conducted. - Order iron studies to evaluate anemia.     Orders:   Iron, TIBC and Ferritin Panel

## 2023-10-29 NOTE — Assessment & Plan Note (Addendum)
 Needs for work Orders:   QuantiFERON-TB Gold Plus

## 2023-11-01 ENCOUNTER — Ambulatory Visit: Payer: Self-pay | Admitting: Family Medicine

## 2023-11-01 LAB — QUANTIFERON-TB GOLD PLUS
QuantiFERON Mitogen Value: >10 [IU]/mL
QuantiFERON Nil Value: 0.03 [IU]/mL
QuantiFERON TB1 Ag Value: 0.05 [IU]/mL
QuantiFERON TB2 Ag Value: 0.06 [IU]/mL
QuantiFERON-TB Gold Plus: NEGATIVE

## 2023-11-01 LAB — COMPREHENSIVE METABOLIC PANEL WITH GFR
ALT: 10 IU/L (ref 0–32)
AST: 14 IU/L (ref 0–40)
Albumin: 4.3 g/dL (ref 3.9–4.9)
Alkaline Phosphatase: 42 IU/L (ref 41–116)
BUN/Creatinine Ratio: 27 — ABNORMAL HIGH (ref 9–23)
BUN: 17 mg/dL (ref 6–20)
Bilirubin Total: <0.2 mg/dL (ref 0.0–1.2)
CO2: 22 mmol/L (ref 20–29)
Calcium: 8.9 mg/dL (ref 8.7–10.2)
Chloride: 101 mmol/L (ref 96–106)
Creatinine, Ser: 0.63 mg/dL (ref 0.57–1.00)
Globulin, Total: 1.8 g/dL (ref 1.5–4.5)
Glucose: 83 mg/dL (ref 70–99)
Potassium: 4.1 mmol/L (ref 3.5–5.2)
Sodium: 139 mmol/L (ref 134–144)
Total Protein: 6.1 g/dL (ref 6.0–8.5)
eGFR: 116 mL/min/1.73 (ref >59)

## 2023-11-01 LAB — MAGNESIUM: Magnesium: 1.8 mg/dL (ref 1.6–2.3)

## 2023-11-01 LAB — IRON,TIBC AND FERRITIN PANEL
Ferritin: 27 ng/mL (ref 15–150)
Iron Saturation: 19 % (ref 15–55)
Iron: 65 ug/dL (ref 27–159)
Total Iron Binding Capacity: 347 ug/dL (ref 250–450)
UIBC: 282 ug/dL (ref 131–425)

## 2023-11-03 ENCOUNTER — Ambulatory Visit (INDEPENDENT_AMBULATORY_CARE_PROVIDER_SITE_OTHER): Admitting: Family Medicine

## 2023-11-03 ENCOUNTER — Encounter: Payer: Self-pay | Admitting: Family Medicine

## 2023-11-03 VITALS — BP 128/70 | HR 103 | Temp 98.6°F | Resp 18 | Ht 65.0 in | Wt 113.0 lb

## 2023-11-03 DIAGNOSIS — Z30011 Encounter for initial prescription of contraceptive pills: Secondary | ICD-10-CM

## 2023-11-03 DIAGNOSIS — Z30019 Encounter for initial prescription of contraceptives, unspecified: Secondary | ICD-10-CM | POA: Insufficient documentation

## 2023-11-03 DIAGNOSIS — F900 Attention-deficit hyperactivity disorder, predominantly inattentive type: Secondary | ICD-10-CM | POA: Diagnosis not present

## 2023-11-03 DIAGNOSIS — F17200 Nicotine dependence, unspecified, uncomplicated: Secondary | ICD-10-CM | POA: Diagnosis not present

## 2023-11-03 DIAGNOSIS — Z0283 Encounter for blood-alcohol and blood-drug test: Secondary | ICD-10-CM | POA: Insufficient documentation

## 2023-11-03 DIAGNOSIS — Z0001 Encounter for general adult medical examination with abnormal findings: Secondary | ICD-10-CM | POA: Diagnosis not present

## 2023-11-03 LAB — POCT URINE PREGNANCY: Preg Test, Ur: NEGATIVE

## 2023-11-03 MED ORDER — NICOTINE 14 MG/24HR TD PT24: 14.0000 mg | MEDICATED_PATCH | Freq: Every day | TRANSDERMAL | 3 refills | Status: AC

## 2023-11-03 MED ORDER — DROSPIRENONE 4 MG PO TABS: 1.0000 | ORAL_TABLET | Freq: Every day | ORAL | 1 refills | Status: AC

## 2023-11-03 MED ORDER — LISDEXAMFETAMINE DIMESYLATE 20 MG PO CAPS: 20.0000 mg | ORAL_CAPSULE | Freq: Every day | ORAL | 0 refills | Status: DC

## 2023-11-03 NOTE — Assessment & Plan Note (Signed)
 Adult Wellness Visit Routine visit with no significant abnormalities except mild ear redness and known scoliosis. - Schedule annual labs for next year. - needs to reschedule pap  Things to do to keep yourself healthy  - Exercise at least 30-45 minutes a day, 3-4 days a week.  - Eat a low-fat diet with lots of fruits and vegetables, up to 7-9 servings per day.  - Seatbelts can save your life. Wear them always.  - Smoke detectors on every level of your home, check batteries every year.  - Eye Doctor - have an eye exam every 1-2 years  - Safe sex - if you may be exposed to STDs, use a condom.  - Alcohol -  If you drink, do it moderately, less than 2 drinks per day.  - Health Care Power of Attorney. Choose someone to speak for you if you are not able.  - Depression is common in our stressful world.If you're feeling down or losing interest in things you normally enjoy, please come in for a visit.  - Violence - If anyone is threatening or hurting you, please call immediately.
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
## 2023-11-03 NOTE — Progress Notes (Deleted)
 Subjective:  Patient ID: Lynn Matthews, female    DOB: 1985/07/26  Age: 38 y.o. MRN: [id number]  Chief Complaint  Patient presents with   Medical Management of Chronic Issues    HPI: Discussed the use of AI scribe software for clinical note transcription with the patient, who gave verbal consent to proceed.         10/28/2023    2:17 PM 10/05/2023    3:06 PM 03/02/2018   12:26 PM 03/01/2018    2:36 PM  Depression screen PHQ 2/9  Decreased Interest 2 1 3 3   Down, Depressed, Hopeless 1 0 2 1  PHQ - 2 Score 3 1 5 4   Altered sleeping 0 1 2 2   Tired, decreased energy 1 1 2 2   Change in appetite 0 0 2 2  Feeling bad or failure about yourself  0 0 2 2  Trouble concentrating 3 3 2 2   Moving slowly or fidgety/restless 0 0 2 2  Suicidal thoughts 0 0 0 0  PHQ-9 Score 7 6 17 16   Difficult doing work/chores Somewhat difficult Somewhat difficult Not difficult at all Extremely dIfficult        10/28/2023    2:17 PM  Fall Risk   Falls in the past year? 0  Number falls in past yr: 0  Injury with Fall? 0  Risk for fall due to : No Fall Risks  Follow up Falls evaluation completed    Patient Care Team: Teressa Harrie HERO, FNP as PCP - General (Family Medicine) Maurice Loving, MD as Referring Physician (Psychiatry) Lenon Reda BIRCH, PA-C (Physician Assistant)   Review of Systems  Constitutional:  Negative for chills, diaphoresis, fatigue and fever.  HENT:  Negative for congestion, ear pain and sinus pain.   Eyes: Negative.   Respiratory:  Negative for cough and shortness of breath.   Cardiovascular:  Negative for chest pain.  Gastrointestinal:  Negative for abdominal pain, constipation, diarrhea, nausea and vomiting.  Endocrine: Negative.   Genitourinary:  Negative for dysuria, frequency and urgency.  Musculoskeletal:  Negative for arthralgias.  Skin: Negative.   Allergic/Immunologic: Negative.   Neurological:  Negative for dizziness, weakness, light-headedness and headaches.   Hematological: Negative.   Psychiatric/Behavioral:  Negative for dysphoric mood. The patient is not nervous/anxious.     Current Outpatient Medications on File Prior to Visit  Medication Sig Dispense Refill   amphetamine-dextroamphetamine (ADDERALL) 20 MG tablet Take 1 tablet (20 mg total) by mouth daily. 30 tablet 0   ARIPiprazole (ABILIFY) 10 MG tablet Take 10 mg by mouth daily.     atomoxetine (STRATTERA) 10 MG capsule Take 1 capsule (10 mg total) by mouth daily. 30 capsule 0   magnesium oxide (MAG-OX) 400 (240 Mg) MG tablet Take 400 mg by mouth daily.     nicotine (NICODERM CQ - DOSED IN MG/24 HOURS) 14 mg/24hr patch Place 14 mg onto the skin daily.     prazosin (MINIPRESS) 1 MG capsule Take 1 mg by mouth at bedtime.     sertraline (ZOLOFT) 50 MG tablet Take 50 mg by mouth daily.     spironolactone (ALDACTONE) 25 MG tablet Take 1 tablet (25 mg total) by mouth daily. 90 tablet 0   No current facility-administered medications on file prior to visit.   Past Medical History:  Diagnosis Date   Allergy    Seasonal   Anxiety    Chronic pain    Depression    Hypertension    Kidney stones  Past Surgical History:  Procedure Laterality Date   BREAST LUMPECTOMY Right    removal of kidney stone     via lithotripsy    Family History  Problem Relation Age of Onset   Hyperlipidemia Mother    Hypertension Father    Heart disease Father    Hyperlipidemia Brother    Diabetes Maternal Grandfather    Miscarriages / Stillbirths Maternal Grandmother    Heart disease Paternal Grandfather    Cancer Paternal Grandmother    Miscarriages / Stillbirths Paternal Grandmother    Miscarriages / Stillbirths Paternal Aunt    Stroke Maternal Uncle    Stroke Maternal Uncle    Heart disease Paternal Uncle    Social History   Socioeconomic History   Marital status: Significant Other    Spouse name: Not on file   Number of children: Not on file   Years of education: Not on file   Highest  education level: Not on file  Occupational History   Not on file  Tobacco Use   Smoking status: Never   Smokeless tobacco: Never  Vaping Use   Vaping status: Every Day   Substances: Nicotine, THC, Flavoring  Substance and Sexual Activity   Alcohol use: Yes    Comment: social use   Drug use: No   Sexual activity: Not Currently    Birth control/protection: None    Comment: withdraw method  Other Topics Concern   Not on file  Social History Narrative   Not on file   Social Drivers of Health   Financial Resource Strain: Not on file  Food Insecurity: Low Risk  (10/14/2023)   Received from [facility]   Hunger Vital Sign    Within the past 12 months, you worried that your food would run out before you got money to buy more: Never true    Within the past 12 months, the food you bought just didn't last and you didn't have money to get more. : Never true  Transportation Needs: Unmet Transportation Needs (10/14/2023)   Received from [facility]    In the past 12 months, has lack of reliable transportation kept you from medical appointments, meetings, work or from getting things needed for daily living? : Yes  Physical Activity: Not on file  Stress: Not on file  Social Connections: Unknown (03/25/2019)   Received from [facility]   Social Connections    Frequency of Communication with Friends and Family: Not asked    Frequency of Social Gatherings with Friends and Family: Not asked    Objective:  BP 128/70   Pulse (!) 103   Temp 98.6 F (37 C) (Temporal)   Resp 18   Ht 5' 5 (1.651 m)   Wt 113 lb (51.3 kg)   LMP 07/19/2023 (Approximate)   SpO2 100%   BMI 18.80 kg/m      11/03/2023    2:03 PM 10/28/2023    2:10 PM 10/05/2023    1:53 PM  BP/Weight  Systolic BP 128 124 132  Diastolic BP 70 72 68  Wt. (Lbs) 113 115.8 107.8  BMI 18.8 kg/m2 19.27 kg/m2 17.94 kg/m2    Physical Exam  {Perform Simple Foot Exam  Perform Detailed exam:1} {Insert  foot Exam (Optional):30965}   Lab Results  Component Value Date   WBC 7.1 03/02/2018   HGB 11.1 03/02/2018   HCT 32.9 (L) 03/02/2018   PLT 223 03/02/2018   GLUCOSE 83 10/28/2023   CHOL 193 03/02/2018  TRIG 47 03/02/2018   HDL 93 03/02/2018   LDLCALC 91 03/02/2018   ALT 10 10/28/2023   AST 14 10/28/2023   NA 139 10/28/2023   K 4.1 10/28/2023   CL 101 10/28/2023   CREATININE 0.63 10/28/2023   BUN 17 10/28/2023   CO2 22 10/28/2023   TSH 2.440 03/02/2018   INR 0.95 10/18/2015   HGBA1C 4.9 03/02/2018    Results for orders placed or performed in visit on 10/28/23  Iron, TIBC and Ferritin Panel   Collection Time: 10/28/23  3:17 PM  Result Value Ref Range   Total Iron Binding Capacity 347 250 - 450 ug/dL   UIBC 717 868 - 574 ug/dL   Iron 65 27 - 840 ug/dL   Iron Saturation 19 15 - 55 %   Ferritin 27 15 - 150 ng/mL  Comprehensive metabolic panel with GFR   Collection Time: 10/28/23  3:17 PM  Result Value Ref Range   Glucose 83 70 - 99 mg/dL   BUN 17 6 - 20 mg/dL   Creatinine, Ser 9.36 0.57 - 1.00 mg/dL   eGFR 883 >40 fO/fpw/8.26   BUN/Creatinine Ratio 27 (H) 9 - 23   Sodium 139 134 - 144 mmol/L   Potassium 4.1 3.5 - 5.2 mmol/L   Chloride 101 96 - 106 mmol/L   CO2 22 20 - 29 mmol/L   Calcium 8.9 8.7 - 10.2 mg/dL   Total Protein 6.1 6.0 - 8.5 g/dL   Albumin 4.3 3.9 - 4.9 g/dL   Globulin, Total 1.8 1.5 - 4.5 g/dL   Bilirubin Total <9.7 0.0 - 1.2 mg/dL   Alkaline Phosphatase 42 41 - 116 IU/L   AST 14 0 - 40 IU/L   ALT 10 0 - 32 IU/L  Magnesium   Collection Time: 10/28/23  3:17 PM  Result Value Ref Range   Magnesium 1.8 1.6 - 2.3 mg/dL  QuantiFERON-TB Gold Plus   Collection Time: 10/28/23  3:17 PM  Result Value Ref Range   QuantiFERON Incubation Incubation performed.    QuantiFERON Criteria Comment    QuantiFERON TB1 Ag Value 0.05 IU/mL   QuantiFERON TB2 Ag Value 0.06 IU/mL   QuantiFERON Nil Value 0.03 IU/mL   QuantiFERON Mitogen Value >10.00 IU/mL    QuantiFERON-TB Gold Plus Negative Negative  .  Assessment & Plan:   Assessment & Plan    Body mass index is 18.8 kg/m.  Assessment and Plan      No orders of the defined types were placed in this encounter.   No orders of the defined types were placed in this encounter.      Follow-up: No follow-ups on file.  An After Visit Summary was printed and given to the patient.  Harrie CHRISTELLA Cedar, FNP [facility] [phone]

## 2023-11-03 NOTE — Assessment & Plan Note (Signed)
 Nicotine dependence Previously managed with patches. Currently using vaping. Continued nicotine replacement therapy discussed. - Prescribe 14 mg nicotine patch. - [facility]

## 2023-11-03 NOTE — Assessment & Plan Note (Signed)
 Attention-deficit hyperactivity disorder, predominantly inattentive type Insurance did not cover initial medication. Strattera was used. Discussed alternatives and specialist coordination. - Discontinue Strattera. - Prescribe Vyvanse for one month. - Coordinate with specialist for ongoing ADHD management.

## 2023-11-03 NOTE — Assessment & Plan Note (Signed)
 Contraceptive management Interested in birth control. Discussed pregnancy test necessity before initiation. No pill preference. - Order urine pregnancy test. - Prescribe birth control pill after negative pregnancy test.

## 2023-11-04 DIAGNOSIS — Z30011 Encounter for initial prescription of contraceptive pills: Secondary | ICD-10-CM | POA: Insufficient documentation

## 2023-11-08 LAB — BENZODIAZEPINES,MS,WB/SP RFX
7-Aminoclonazepam: NEGATIVE ng/mL
Alprazolam: NEGATIVE ng/mL
Benzodiazepines Confirm: POSITIVE
Chlordiazepoxide: NEGATIVE
Clonazepam: NEGATIVE ng/mL
Desalkylflurazepam: NEGATIVE ng/mL
Desmethylchlordiazepoxide: NEGATIVE
Desmethyldiazepam: 50 ng/mL
Diazepam: 14 ng/mL
Flurazepam: NEGATIVE ng/mL
Lorazepam: NEGATIVE ng/mL
Midazolam: NEGATIVE ng/mL
Oxazepam: NEGATIVE ng/mL
Temazepam: NEGATIVE ng/mL
Triazolam: NEGATIVE ng/mL

## 2023-11-08 LAB — DRUG SCREEN 10 W/CONF, SERUM
Amphetamines, IA: NEGATIVE ng/mL
Barbiturates, IA: NEGATIVE ug/mL
Benzodiazepines, IA: POSITIVE ng/mL — AB
Cocaine & Metabolite, IA: NEGATIVE ng/mL
Methadone, IA: NEGATIVE ng/mL
Opiates, IA: NEGATIVE ng/mL
Oxycodones, IA: NEGATIVE ng/mL
Phencyclidine, IA: NEGATIVE ng/mL
Propoxyphene, IA: NEGATIVE ng/mL
THC(Marijuana) Metabolite, IA: NEGATIVE ng/mL

## 2023-11-09 ENCOUNTER — Other Ambulatory Visit: Payer: Self-pay

## 2023-11-10 ENCOUNTER — Ambulatory Visit: Payer: Self-pay | Admitting: Family Medicine

## 2023-11-29 ENCOUNTER — Other Ambulatory Visit: Payer: Self-pay | Admitting: Family Medicine

## 2023-11-29 DIAGNOSIS — F900 Attention-deficit hyperactivity disorder, predominantly inattentive type: Secondary | ICD-10-CM

## 2023-11-29 NOTE — Telephone Encounter (Unsigned)
 Copied from CRM #[id number]. Topic: Clinical - Medication Refill >> Nov 29, 2023  4:41 PM Sophia H wrote: Medication: prazosin  (MINIPRESS ) 1 MG capsule lisdexamfetamine (VYVANSE ) 20 MG capsule  Has the patient contacted their pharmacy? Yes, controlled substance.   This is the patient's preferred pharmacy:   [id number] [place] [place] [address], [address] [facility] [address], [address] [place] [address] Phone: [phone] Fax: [phone]  Is this the correct pharmacy for this prescription? Yes If no, delete pharmacy and type the correct one.   Has the prescription been filled recently? Yes  Is the patient out of the medication? Yes  Has the patient been seen for an appointment in the last year OR does the patient have an upcoming appointment? Yes, seen November 03, 2023.   Can we respond through MyChart? Yes  Agent: Please be advised that Rx refills may take up to 3 business days. We ask that you follow-up with your pharmacy.

## 2023-11-30 MED ORDER — PRAZOSIN HCL 1 MG PO CAPS: 1.0000 mg | ORAL_CAPSULE | Freq: Every day | ORAL | 1 refills | Status: DC

## 2023-11-30 MED ORDER — LISDEXAMFETAMINE DIMESYLATE 20 MG PO CAPS: 20.0000 mg | ORAL_CAPSULE | Freq: Every day | ORAL | 0 refills | Status: AC

## 2023-12-23 ENCOUNTER — Other Ambulatory Visit: Payer: Self-pay | Admitting: Family Medicine
